# Patient Record
Sex: Male | Born: 1950 | Hispanic: No | Marital: Married | Smoking: Former smoker
Health system: Southern US, Community
[De-identification: ages and names within clinical notes are randomized; demographics above are authoritative.]

## PROBLEM LIST (undated history)

## (undated) DIAGNOSIS — I219 Acute myocardial infarction, unspecified: Secondary | ICD-10-CM

## (undated) DIAGNOSIS — I4510 Unspecified right bundle-branch block: Secondary | ICD-10-CM

## (undated) DIAGNOSIS — R7303 Prediabetes: Secondary | ICD-10-CM

## (undated) DIAGNOSIS — I10 Essential (primary) hypertension: Secondary | ICD-10-CM

## (undated) DIAGNOSIS — E785 Hyperlipidemia, unspecified: Secondary | ICD-10-CM

## (undated) DIAGNOSIS — I2510 Atherosclerotic heart disease of native coronary artery without angina pectoris: Secondary | ICD-10-CM

## (undated) HISTORY — DX: Atherosclerotic heart disease of native coronary artery without angina pectoris: I25.10

## (undated) HISTORY — DX: Prediabetes: R73.03

## (undated) HISTORY — PX: APPENDECTOMY: SHX54

## (undated) HISTORY — DX: Essential (primary) hypertension: I10

## (undated) HISTORY — DX: Acute myocardial infarction, unspecified: I21.9

## (undated) HISTORY — DX: Unspecified right bundle-branch block: I45.10

## (undated) HISTORY — DX: Hyperlipidemia, unspecified: E78.5

---

## 2008-11-15 ENCOUNTER — Emergency Department (HOSPITAL_COMMUNITY): Admission: EM | Admit: 2008-11-15 | Discharge: 2008-11-15 | Payer: Self-pay | Admitting: Emergency Medicine

## 2010-10-12 HISTORY — PX: CORONARY STENT PLACEMENT: SHX1402

## 2010-11-09 ENCOUNTER — Ambulatory Visit: Payer: Self-pay | Admitting: Cardiovascular Disease

## 2010-11-09 ENCOUNTER — Inpatient Hospital Stay (HOSPITAL_COMMUNITY): Admission: EM | Admit: 2010-11-09 | Discharge: 2010-11-11 | Payer: Self-pay | Admitting: Emergency Medicine

## 2010-11-09 ENCOUNTER — Emergency Department (HOSPITAL_COMMUNITY): Admission: EM | Admit: 2010-11-09 | Discharge: 2010-11-09 | Payer: Self-pay | Admitting: Family Medicine

## 2010-11-09 DIAGNOSIS — I219 Acute myocardial infarction, unspecified: Secondary | ICD-10-CM

## 2010-11-09 HISTORY — DX: Acute myocardial infarction, unspecified: I21.9

## 2010-11-23 ENCOUNTER — Ambulatory Visit: Payer: Self-pay | Admitting: Cardiology

## 2010-12-07 ENCOUNTER — Ambulatory Visit: Payer: Self-pay | Admitting: Cardiology

## 2010-12-28 LAB — BASIC METABOLIC PANEL: Glucose: 163 mg/dL

## 2010-12-30 ENCOUNTER — Ambulatory Visit: Payer: Self-pay | Admitting: Cardiology

## 2011-01-31 ENCOUNTER — Encounter: Payer: Self-pay | Admitting: Cardiology

## 2011-01-31 DIAGNOSIS — I10 Essential (primary) hypertension: Secondary | ICD-10-CM | POA: Insufficient documentation

## 2011-01-31 DIAGNOSIS — I4510 Unspecified right bundle-branch block: Secondary | ICD-10-CM | POA: Insufficient documentation

## 2011-01-31 DIAGNOSIS — E785 Hyperlipidemia, unspecified: Secondary | ICD-10-CM | POA: Insufficient documentation

## 2011-02-22 LAB — BASIC METABOLIC PANEL
BUN: 11 mg/dL (ref 6–23)
BUN: 14 mg/dL (ref 6–23)
BUN: 15 mg/dL (ref 6–23)
CO2: 23 mEq/L (ref 19–32)
CO2: 26 mEq/L (ref 19–32)
Calcium: 8.3 mg/dL — ABNORMAL LOW (ref 8.4–10.5)
Chloride: 105 mEq/L (ref 96–112)
Chloride: 106 mEq/L (ref 96–112)
Creatinine, Ser: 0.86 mg/dL (ref 0.4–1.5)
Creatinine, Ser: 0.89 mg/dL (ref 0.4–1.5)
Creatinine, Ser: 0.9 mg/dL (ref 0.4–1.5)
Glucose, Bld: 105 mg/dL — ABNORMAL HIGH (ref 70–99)
Glucose, Bld: 112 mg/dL — ABNORMAL HIGH (ref 70–99)
Potassium: 3.6 mEq/L (ref 3.5–5.1)
Potassium: 3.9 mEq/L (ref 3.5–5.1)
Sodium: 139 mEq/L (ref 135–145)

## 2011-02-22 LAB — CARDIAC PANEL(CRET KIN+CKTOT+MB+TROPI)
CK, MB: 3.7 ng/mL (ref 0.3–4.0)
CK, MB: 4.4 ng/mL — ABNORMAL HIGH (ref 0.3–4.0)
Relative Index: 0.9 (ref 0.0–2.5)

## 2011-02-22 LAB — CBC
HCT: 38.9 % — ABNORMAL LOW (ref 39.0–52.0)
HCT: 46 % (ref 39.0–52.0)
Hemoglobin: 14.8 g/dL (ref 13.0–17.0)
MCH: 30.6 pg (ref 26.0–34.0)
MCH: 30.6 pg (ref 26.0–34.0)
MCHC: 34.1 g/dL (ref 30.0–36.0)
MCV: 89.6 fL (ref 78.0–100.0)
MCV: 89.8 fL (ref 78.0–100.0)
Platelets: 223 10*3/uL (ref 150–400)
RBC: 4.34 MIL/uL (ref 4.22–5.81)
RBC: 4.83 MIL/uL (ref 4.22–5.81)
RDW: 13.3 % (ref 11.5–15.5)
RDW: 13.4 % (ref 11.5–15.5)
WBC: 8.6 10*3/uL (ref 4.0–10.5)

## 2011-02-22 LAB — LIPID PANEL
Cholesterol: 252 mg/dL — ABNORMAL HIGH (ref 0–200)
LDL Cholesterol: 187 mg/dL — ABNORMAL HIGH (ref 0–99)
Total CHOL/HDL Ratio: 5.6 RATIO

## 2011-02-22 LAB — CK TOTAL AND CKMB (NOT AT ARMC)
CK, MB: 7.4 ng/mL (ref 0.3–4.0)
Relative Index: 1 (ref 0.0–2.5)
Total CK: 716 U/L — ABNORMAL HIGH (ref 7–232)

## 2011-02-22 LAB — POCT CARDIAC MARKERS
CKMB, poc: 8.4 ng/mL (ref 1.0–8.0)
Troponin i, poc: <0.05 ng/mL (ref 0.00–0.09)

## 2011-02-22 LAB — DIFFERENTIAL
Basophils Absolute: 0 10*3/uL (ref 0.0–0.1)
Basophils Relative: 0 % (ref 0–1)
Eosinophils Absolute: 0.1 10*3/uL (ref 0.0–0.7)
Eosinophils Relative: 1 % (ref 0–5)
Monocytes Absolute: 0.9 10*3/uL (ref 0.1–1.0)

## 2011-02-22 LAB — PROTIME-INR: Prothrombin Time: 13.4 seconds (ref 11.6–15.2)

## 2011-02-22 LAB — HEPARIN LEVEL (UNFRACTIONATED): Heparin Unfractionated: 0.65 IU/mL (ref 0.30–0.70)

## 2011-03-11 ENCOUNTER — Encounter: Payer: Self-pay | Admitting: Cardiology

## 2011-03-11 ENCOUNTER — Ambulatory Visit (INDEPENDENT_AMBULATORY_CARE_PROVIDER_SITE_OTHER): Payer: Self-pay | Admitting: Cardiology

## 2011-03-11 DIAGNOSIS — I10 Essential (primary) hypertension: Secondary | ICD-10-CM

## 2011-03-11 DIAGNOSIS — E119 Type 2 diabetes mellitus without complications: Secondary | ICD-10-CM | POA: Insufficient documentation

## 2011-03-11 DIAGNOSIS — R7303 Prediabetes: Secondary | ICD-10-CM | POA: Insufficient documentation

## 2011-03-11 DIAGNOSIS — R079 Chest pain, unspecified: Secondary | ICD-10-CM

## 2011-03-11 NOTE — Assessment & Plan Note (Signed)
The patient is trying to stay on a careful diet.  He also is still on metformin 500 mg daily.  His last blood sugar in January was 163.  He is not having any hypoglycemic reactions.

## 2011-03-11 NOTE — Assessment & Plan Note (Signed)
On his last visit we increased his lisinopril to twice a day and continued his hydrochlorothiazide 25 mg daily.  He returns today and his blood pressure is normal.  He'll continue same blood pressure meds.

## 2011-03-11 NOTE — Assessment & Plan Note (Signed)
The patient has a past history of a non-ST elevation myocardial infarction on 11/09/10.  He was found at cardiac catheterization on 11/10/10 to have significant proximal LAD stenosis and he underwent PCI with a drug-eluting stent.  He has not had any chest pain at rest.  He still has some chest tightness with strenuous exertion.  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  He is now looking for other types of work.

## 2011-03-11 NOTE — Progress Notes (Signed)
HPI: This pleasant 60 year old Spanish-speaking gentleman is seen for a scheduled two-month followup office visit.  He has a history of ischemic heart disease, prior non-ST elevation myocardial infarction, placement of a drug-eluting stent in his LAD, and a history of essential hypertension hypercholesterolemia and adult onset diabetes mellitus.  He was initially hospitalized on 11/09/10 and underwent cardiac catheterization on 11/10/10.  He was found to have single vessel LAD disease which required a drug-eluting stent.  At the time of his admission his cholesterol was 252 and LDL was 187.  He was sent home on pravastatin.  Metformin was added at his last Office visit.  Current Outpatient Prescriptions  Medication Sig Dispense Refill  . aspirin 325 MG tablet Take 325 mg by mouth daily.        . clopidogrel (PLAVIX) 75 MG tablet Take by mouth daily.        Marland Kitchen diltiazem (CARDIZEM) 90 MG tablet Take 90 mg by mouth 2 (two) times daily.        . hydrochlorothiazide 25 MG tablet Take 25 mg by mouth daily.        Marland Kitchen lisinopril (PRINIVIL,ZESTRIL) 20 MG tablet Take 20 mg by mouth 2 (two) times daily.        . metFORMIN (GLUMETZA) 500 MG (MOD) 24 hr tablet Take 500 mg by mouth daily.        . metoprolol (LOPRESSOR) 50 MG tablet Take 50 mg by mouth 2 (two) times daily.        . nitroGLYCERIN (NITROSTAT) 0.4 MG SL tablet Place 0.4 mg under the tongue every 5 (five) minutes as needed.        . pravastatin (PRAVACHOL) 80 MG tablet Take 80 mg by mouth daily.          No Known Allergies  Patient Active Problem List  Diagnoses  . HTN (hypertension)  . Hyperlipemia  . Myocardial infarction  . Incomplete RBBB  . Chest pain on exertion  . Diabetes mellitus type 2, noninsulin dependent    History  Smoking status  . Former Smoker  . Types: Cigarettes  . Quit date: 01/31/1981  Smokeless tobacco  . Not on file    History  Alcohol Use     No family history on file.  Review of Systems: The patient  denies any heat or cold intolerance.  No weight gain or weight loss.  The patient denies headaches or blurry vision.  There is no cough or sputum production.  The patient denies dizziness.  There is no hematuria or hematochezia.  The patient denies any muscle aches or arthritis.  The patient denies any rash.  The patient denies frequent falling or instability.  There is no history of depression or anxiety.  All other systems were reviewed and are negative.   Physical Exam: Filed Vitals:   03/11/11 1547  BP: 126/80  Pulse: 64  This is a well-developed well-nourished muscular gentleman in no acute distress.Pupils equal and reactive.   Extraocular Movements are full.  There is no scleral icterus.  The mouth and pharynx are normal.  The neck is supple.  The carotids reveal no bruits.  The jugular venous pressure is normal.  The thyroid is not enlarged.  There is no lymphadenopathy.The chest is clear to percussion and auscultation. There are no rales or rhonchi. Expansion of the chest is symmetrical.The precordium is quiet.  The first heart sound is normal.  The second heart sound is physiologically split.  There is no murmur gallop  rub or click.  There is no abnormal lift or heave.The abdomen is soft and nontender. Bowel sounds are normal. The liver and spleen are not enlarged. There Are no abdominal masses. There are no bruits.The pedal pulses are good.  There is no phlebitis or edema.  There is no cyanosis or clubbing.Strength is normal and symmetrical in all extremities.  There is no lateralizing weakness.  There are no sensory deficits.    Assessment / Plan: Continue present medication.  Recheck in 3 months for followup office visit EKG and fasting lipid panel and chemistries.

## 2011-03-31 ENCOUNTER — Encounter: Payer: Self-pay | Admitting: Cardiology

## 2011-06-06 ENCOUNTER — Other Ambulatory Visit: Payer: Self-pay | Admitting: Cardiovascular Disease

## 2011-06-07 ENCOUNTER — Other Ambulatory Visit: Payer: Self-pay | Admitting: *Deleted

## 2011-06-07 DIAGNOSIS — I10 Essential (primary) hypertension: Secondary | ICD-10-CM

## 2011-06-07 MED ORDER — LISINOPRIL 20 MG PO TABS: ORAL_TABLET | ORAL | Status: DC

## 2011-06-07 NOTE — Telephone Encounter (Signed)
Called pharmacy took care of this issue

## 2011-06-14 ENCOUNTER — Telehealth: Payer: Self-pay | Admitting: Cardiology

## 2011-06-14 NOTE — Telephone Encounter (Signed)
Will get clearance at office visit

## 2011-06-14 NOTE — Telephone Encounter (Signed)
SON IN [employer] CALLING TO RESCHEDULE APPT.  ADVISED UNABLE TO SCHEDULE NEXT WEEK.  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  AND NEEDS WORK CLEARANCE.  PLEASE CALL.

## 2011-06-14 NOTE — Telephone Encounter (Signed)
Reschedule appointment

## 2011-06-21 ENCOUNTER — Other Ambulatory Visit: Payer: Self-pay | Admitting: *Deleted

## 2011-06-21 DIAGNOSIS — E785 Hyperlipidemia, unspecified: Secondary | ICD-10-CM

## 2011-06-22 ENCOUNTER — Ambulatory Visit: Payer: Self-pay | Admitting: Cardiology

## 2011-06-22 ENCOUNTER — Other Ambulatory Visit: Payer: Self-pay | Admitting: *Deleted

## 2011-06-28 ENCOUNTER — Encounter: Payer: Self-pay | Admitting: Cardiology

## 2011-06-29 ENCOUNTER — Encounter: Payer: Self-pay | Admitting: Nurse Practitioner

## 2011-06-29 ENCOUNTER — Other Ambulatory Visit (INDEPENDENT_AMBULATORY_CARE_PROVIDER_SITE_OTHER): Payer: Self-pay | Admitting: *Deleted

## 2011-06-29 ENCOUNTER — Ambulatory Visit (INDEPENDENT_AMBULATORY_CARE_PROVIDER_SITE_OTHER): Payer: Self-pay | Admitting: Nurse Practitioner

## 2011-06-29 VITALS — BP 158/92 | HR 68 | Ht 68.0 in | Wt 197.0 lb

## 2011-06-29 DIAGNOSIS — E785 Hyperlipidemia, unspecified: Secondary | ICD-10-CM

## 2011-06-29 DIAGNOSIS — I10 Essential (primary) hypertension: Secondary | ICD-10-CM

## 2011-06-29 DIAGNOSIS — I2510 Atherosclerotic heart disease of native coronary artery without angina pectoris: Secondary | ICD-10-CM

## 2011-06-29 LAB — HEPATIC FUNCTION PANEL
ALT: 24 U/L (ref 0–53)
AST: 22 U/L (ref 0–37)
Albumin: 4.4 g/dL (ref 3.5–5.2)
Alkaline Phosphatase: 59 U/L (ref 39–117)
Bilirubin, Direct: 0 mg/dL (ref 0.0–0.3)
Total Bilirubin: 0.6 mg/dL (ref 0.3–1.2)
Total Protein: 7.4 g/dL (ref 6.0–8.3)

## 2011-06-29 LAB — BASIC METABOLIC PANEL
BUN: 22 mg/dL (ref 6–23)
CO2: 29 mEq/L (ref 19–32)
Calcium: 8.9 mg/dL (ref 8.4–10.5)
Chloride: 108 mEq/L (ref 96–112)
Creatinine, Ser: 1.1 mg/dL (ref 0.4–1.5)
GFR: 76.01 mL/min (ref >60.00)
Glucose, Bld: 122 mg/dL — ABNORMAL HIGH (ref 70–99)
Potassium: 4.7 mEq/L (ref 3.5–5.1)
Sodium: 143 mEq/L (ref 135–145)

## 2011-06-29 LAB — LIPID PANEL
Cholesterol: 190 mg/dL (ref 0–200)
HDL: 47.5 mg/dL (ref >39.00)
LDL Cholesterol: 104 mg/dL — ABNORMAL HIGH (ref 0–99)
Total CHOL/HDL Ratio: 4
Triglycerides: 195 mg/dL — ABNORMAL HIGH (ref 0.0–149.0)
VLDL: 39 mg/dL (ref 0.0–40.0)

## 2011-06-29 NOTE — Patient Instructions (Addendum)
Get back on all your medicines I want to see you in a month Watch your salt You may return to work with light duties. No heavy lifting over 30 pounds. Your labs are checked today. Call for any problems.

## 2011-06-29 NOTE — Assessment & Plan Note (Signed)
Prior NSTEMI with DES to the LAD in November of 2011. He is doing well. He may return to light duties. No heavy lifting over 30 pounds.

## 2011-06-29 NOTE — Assessment & Plan Note (Signed)
Labs are checked today.  

## 2011-06-29 NOTE — Progress Notes (Signed)
    Phillip Wells Date of Birth: 07-11-1951   History of Present Illness: [name] is seen back today for a 4 month visit. He is seen for Dr. Patty Sermons. He had a NSTEMI in November, treated with DES to the LAD. He has multiple risk factors. He is here with his family member who serves as the interpreter. He does not speak [place]. Overall, he is doing well. He continues to have some chest pain with heavy lifting. He had been let go from his job because of this, but now they would like for him to return to lighter duties. His medicines are difficult to clarify. It sounds like he has missed some due to a recent trip to [place]. He has no complaint.  Current Outpatient Prescriptions on File Prior to Visit  Medication Sig Dispense Refill  . aspirin 325 MG tablet Take 325 mg by mouth daily.        . clopidogrel (PLAVIX) 75 MG tablet Take by mouth daily.        Marland Kitchen diltiazem (CARDIZEM) 90 MG tablet TAKE ONE TABLET BY MOUTH TWICE DAILY  60 tablet  12  . lisinopril (PRINIVIL,ZESTRIL) 20 MG tablet Daily or as directed  90 tablet  3  . metFORMIN (GLUMETZA) 500 MG (MOD) 24 hr tablet Take 500 mg by mouth daily.        . metoprolol (LOPRESSOR) 50 MG tablet TAKE ONE TABLET BY MOUTH TWICE DAILY  60 tablet  12  . nitroGLYCERIN (NITROSTAT) 0.4 MG SL tablet Place 0.4 mg under the tongue every 5 (five) minutes as needed.        . pravastatin (PRAVACHOL) 80 MG tablet TAKE ONE TABLET BY MOUTH AT BEDTIME  30 tablet  12  . hydrochlorothiazide 25 MG tablet Take 25 mg by mouth daily.          No Known Allergies  Past Medical History  Diagnosis Date  . Coronary artery disease     s/p DES to the LAD  . HTN (hypertension)   . Hyperlipemia   . Myocardial infarction 11/09/10  . Incomplete RBBB     Past Surgical History  Procedure Date  . Coronary stent placement November 2011    DES to LAD    History  Smoking status  . Former Smoker  . Types: Cigarettes  . Quit date: 01/31/1981    Smokeless tobacco  . Not on file    History  Alcohol Use No    Family History  Problem Relation Age of Onset  . Heart disease Neg Hx     Review of Systems: The review of systems is as above. He does not check his blood pressure at home.  All other systems were reviewed and are negative.  Physical Exam: BP 158/92  Pulse 68  Ht 5\' 8"  (1.727 m)  Wt 197 lb (89.359 kg)  BMI 29.95 kg/m2 Patient is very pleasant and in no acute distress. He is Spanish speaking. Skin is warm and dry. Color is normal.  He is very tanned. HEENT is unremarkable. Normocephalic/atraumatic. PERRL. Sclera are nonicteric. Neck is supple. No masses. No JVD. Lungs are clear. Cardiac exam shows a regular rate and rhythm. Abdomen is soft. Extremities are without edema. Gait and ROM are intact. No gross neurologic deficits noted.  LABORATORY DATA: PENDING   Assessment / Plan:

## 2011-06-29 NOTE — Assessment & Plan Note (Signed)
Blood pressure is back up. His family member will clarify all of his medicines. He is to get back on all of his medicines. I will see him back in one month.

## 2011-07-01 ENCOUNTER — Telehealth: Payer: Self-pay | Admitting: *Deleted

## 2011-07-01 DIAGNOSIS — E785 Hyperlipidemia, unspecified: Secondary | ICD-10-CM

## 2011-07-01 NOTE — Telephone Encounter (Signed)
Advised son in law of labs.  Faxed to PCP

## 2011-07-01 NOTE — Progress Notes (Signed)
Advised son in law

## 2011-07-01 NOTE — Telephone Encounter (Signed)
Message copied by Burnell Blanks on Fri Jul 01, 2011 11:40 AM ------      Message from: Rosalio Macadamia      Created: Wed Jun 29, 2011  3:04 PM       Ok to report. Send result to his PCP for review.

## 2011-07-01 NOTE — Telephone Encounter (Signed)
Message copied by Burnell Blanks on Fri Jul 01, 2011 11:37 AM ------      Message from: Rosalio Macadamia      Created: Wed Jun 29, 2011  3:05 PM       Ok to report. Lipids aren't too bad. Stay on Pravachol. Need to cut back on sugars in his diet. Recheck in 6 months

## 2011-07-01 NOTE — Telephone Encounter (Signed)
Advised and faxed to PCP

## 2011-08-01 ENCOUNTER — Ambulatory Visit (INDEPENDENT_AMBULATORY_CARE_PROVIDER_SITE_OTHER): Payer: Self-pay | Admitting: Nurse Practitioner

## 2011-08-01 ENCOUNTER — Encounter: Payer: Self-pay | Admitting: Nurse Practitioner

## 2011-08-01 VITALS — BP 138/88 | HR 62 | Ht 67.5 in | Wt 194.8 lb

## 2011-08-01 DIAGNOSIS — I2510 Atherosclerotic heart disease of native coronary artery without angina pectoris: Secondary | ICD-10-CM

## 2011-08-01 DIAGNOSIS — I10 Essential (primary) hypertension: Secondary | ICD-10-CM

## 2011-08-01 NOTE — Assessment & Plan Note (Signed)
Doing well. Back to light duty at work. No chest pain.

## 2011-08-01 NOTE — Assessment & Plan Note (Signed)
Blood pressure looks better today. No change in his medicines. He is advised that he may take an extra Lisinopril for elevated blood pressure. He will think about getting a cuff to monitor at home. Will see him back in 4 months. Patient is agreeable to this plan and will call if any problems develop in the interim.

## 2011-08-01 NOTE — Progress Notes (Signed)
    Phillip Wells Date of Birth: 1950-12-20   History of Present Illness: He is back today for a one month check. He is seen for Phillip Wells. He is taking all of his medicines. His family member is the interpreter. He had a NSTEMI in November treated with DES to the LAD. Blood pressure has been an issue. He is taking all of his medicines. Feels pretty good. Has returned to light duty. He does bruise easily but overall is doing ok.   Current Outpatient Prescriptions on File Prior to Visit  Medication Sig Dispense Refill  . aspirin 325 MG tablet Take 325 mg by mouth daily.        . clopidogrel (PLAVIX) 75 MG tablet Take by mouth daily.        Marland Kitchen diltiazem (CARDIZEM) 90 MG tablet TAKE ONE TABLET BY MOUTH TWICE DAILY  60 tablet  12  . hydrochlorothiazide 25 MG tablet Take 25 mg by mouth daily.        Marland Kitchen lisinopril (PRINIVIL,ZESTRIL) 20 MG tablet Daily or as directed  90 tablet  3  . metFORMIN (GLUMETZA) 500 MG (MOD) 24 hr tablet Take 500 mg by mouth daily.        . metoprolol (LOPRESSOR) 50 MG tablet TAKE ONE TABLET BY MOUTH TWICE DAILY  60 tablet  12  . nitroGLYCERIN (NITROSTAT) 0.4 MG SL tablet Place 0.4 mg under the tongue every 5 (five) minutes as needed.        . pravastatin (PRAVACHOL) 80 MG tablet TAKE ONE TABLET BY MOUTH AT BEDTIME  30 tablet  12    No Known Allergies  Past Medical History  Diagnosis Date  . Coronary artery disease     s/p DES to the LAD  . HTN (hypertension)   . Hyperlipemia   . Myocardial infarction 11/09/10  . Incomplete RBBB     Past Surgical History  Procedure Date  . Coronary stent placement November 2011    DES to LAD    History  Smoking status  . Former Smoker  . Types: Cigarettes  . Quit date: 01/31/1981  Smokeless tobacco  . Not on file    History  Alcohol Use No    Family History  Problem Relation Age of Onset  . Heart disease Neg Hx     Review of Systems: The review of systems is positive for mild bruising. He is  considering getting a blood pressure cuff.  All other systems were reviewed and are negative.  Physical Exam: BP 138/88  Pulse 62  Ht 5' 7.5" (1.715 m)  Wt 194 lb 12.8 oz (88.361 kg)  BMI 30.06 kg/m2 Patient is very pleasant and in no acute distress. Skin is warm and dry. Color is normal.  HEENT is unremarkable. Normocephalic/atraumatic. PERRL. Sclera are nonicteric. Neck is supple. No masses. No JVD. Lungs are clear. Cardiac exam shows a regular rate and rhythm. Abdomen is soft. Extremities are without edema. Gait and ROM are intact. No gross neurologic deficits noted.  LABORATORY DATA:   Assessment / Plan:

## 2011-08-01 NOTE — Patient Instructions (Addendum)
Stay on your current medicines We will see you back in 4 months Call for any problems. If you can get a blood pressure cuff at home that would be great ([place]) Goal BP is less than 140/90 May take an extra Lisinopril if blood pressure staying above 160 to 170/85 to 90. Let us know if you are having to take extra medicines.    Hipertensin (presin arterial elevada) (Hypertension, High Blood Pressure) Cuando el corazn late [employer] a travs de las arterias. La fuerza que se origina es la presin arterial. Si la presin es demasiado elevada, se denomina hipertensin. El peligro radica en que puede sufrirla y no saberlo. Hipertensin puede significar que su corazn debe trabajar ms intensamente para bombear sangre. Las arterias pueden Dietitian o rgidas. [place] extra [place] el riesgo de enfermedades cardacas, ictus y [employer].  La presin arterial est formada por dos nmeros: el nmero mayor sobre el nmero menor, por ejemplo110/70. Se seala "110/72". Los valores ideales son por debajo de 120 para el nmero ms alto (sistlica) y por debajo de 80 para el ms bajo (diastlica). Anote su presin sangunea hoy. Debe prestar mucha atencin a su presin arterial si sufre alguna otra enfermedad como:  Insuficiencia cardaca  Ataques cardiacos previos   Diabetes   Enfermedad renal crnica   Ictus previo   Mltiples factores de riesgo para enfermedades cardacas   Para diagnosticar si usted sufre hipertensin arterial, debe medirse la presin mientras encuentra sentado con el brazo elevado a la altura del nivel del corazn. Debe medirse al menos 2 veces. Una nica lectura de presin arterial elevada (especialmente en el servicio de emergencias) no significa que necesita tratamiento. Hay enfermedades en las que la presin arterial es diferente en ambos brazos. Es importante que consulte rpidamente con su mdico para un control. La [employer] de las personas sufren  hipertensin esencial, lo que significa que no tiene una causa especfica. Este tipo de hipertensin puede bajarse modificando algunos factores en el estilo de vida como:  Librarian, academic.  El consumo de cigarrillos.   La falta de actividad fsica.   Peso excesivo  Consumo de drogas y alcohol.   Consumiendo menos sal   La mayora de las personas no tienen sntomas hasta que la hipertensin ocasiona un dao en el organismo. El tratamiento efectivo puede evitar, Designer, industrial/product o reducir ese dao. TRATAMIENTO: El tratamiento para la hipertensin, cuando se ha identificado una causa, est dirigido a la misma Hay un gran nmero en medicamentos para tratarla. Se agrupan en diferentes categoras y Media planner los medicamentos indicados para usted. Muchos medicamentos disponibles tienen efectos secundarios. Debe comentar los efectos secundarios con su mdico. Si la presin arterial permanece elevada despus de modificar su estilo de vida o comenzar a tomar medicamentos:  Los medicamentos deben ser reemplazados   Puede ser necesario evaluar otros problemas.   Debe estar seguro que comprende las indicaciones, que sabe cmo y cundo [employer].   Asegrese de Education officer, environmental un control con su mdico dentro del tiempo indicado (generalmente dentro de las [employer]) para volver a Systems analyst presin arterial y Dentist los medicamentos prescritos.   Si est tomando ms de un medicamento para la presin arterial, asegrese que sabe cmo y en qu momentos debe tomarlos. Tomar los medicamentos al mismo tiempo puede dar como resultado un gran descenso en la presin arterial.  SOLICITE ATENCIN MDICA DE INMEDIATO SI PRESENTA:  Dolor de cabeza intenso, visin borrosa o cambios en la  visin, o confusin.   Debilidad o adormecimientos inusuales o sensacin de desmayo.   Dolor de pecho o abdominal intenso, vmitos o problemas para respirar.  ASEGRESE QUE:   Comprende estas instrucciones.    Controlar su enfermedad.   Solicitar ayuda inmediatamente si no mejora o si empeora.  Document Released: 11/28/2005 Document Re-Released: 05/18/2010 [facility] Patient Information 2011 [place], [place].

## 2011-09-16 LAB — POCT URINALYSIS DIP (DEVICE)
Hgb urine dipstick: NEGATIVE
Nitrite: NEGATIVE
Urobilinogen, UA: 0.2 mg/dL (ref 0.0–1.0)
pH: 7 (ref 5.0–8.0)

## 2011-12-02 ENCOUNTER — Encounter: Payer: Self-pay | Admitting: Cardiology

## 2011-12-02 ENCOUNTER — Ambulatory Visit (INDEPENDENT_AMBULATORY_CARE_PROVIDER_SITE_OTHER): Payer: Self-pay | Admitting: Cardiology

## 2011-12-02 VITALS — BP 120/70 | HR 60 | Ht 67.0 in | Wt 187.0 lb

## 2011-12-02 DIAGNOSIS — I119 Hypertensive heart disease without heart failure: Secondary | ICD-10-CM

## 2011-12-02 DIAGNOSIS — E785 Hyperlipidemia, unspecified: Secondary | ICD-10-CM

## 2011-12-02 DIAGNOSIS — E7800 Pure hypercholesterolemia, unspecified: Secondary | ICD-10-CM

## 2011-12-02 DIAGNOSIS — E119 Type 2 diabetes mellitus without complications: Secondary | ICD-10-CM

## 2011-12-02 DIAGNOSIS — I10 Essential (primary) hypertension: Secondary | ICD-10-CM

## 2011-12-02 DIAGNOSIS — I219 Acute myocardial infarction, unspecified: Secondary | ICD-10-CM

## 2011-12-02 DIAGNOSIS — I2510 Atherosclerotic heart disease of native coronary artery without angina pectoris: Secondary | ICD-10-CM

## 2011-12-02 NOTE — Patient Instructions (Signed)
Your physician recommends that you continue on your current medications as directed. Please refer to the Current Medication list given to you today. Your physician recommends that you schedule a follow-up appointment in: 3 months with labs a few days prior to appointment (lp/bmet/hfp/a1c)

## 2011-12-02 NOTE — Progress Notes (Signed)
Pearse Shiffler Paz-Castillo Date of Birth:  July 23, 1951 [facility] [address]   [address] [place], [place]  [address] [phone]           Fax   [phone]  HPI: This pleasant 60 year old gentleman is seen for a scheduled followup office visit.  He has a past history of known ischemic heart disease and had a non-STEMI in November 2011 treated with a drug-eluting stent to his LAD.  He has had a history of high blood pressure and diabetes and hypercholesterol.  Since last visit he has return to work at [employer] duty and stays physically active and works 8 hours a day in [employer].  He has not been experiencing any chest pain or shortness of breath.  Current Outpatient Prescriptions  Medication Sig Dispense Refill  . aspirin 325 MG tablet Take 325 mg by mouth daily.        . clopidogrel (PLAVIX) 75 MG tablet Take by mouth daily.        Marland Kitchen diltiazem (CARDIZEM) 90 MG tablet TAKE ONE TABLET BY MOUTH TWICE DAILY  60 tablet  12  . hydrochlorothiazide 25 MG tablet Take 25 mg by mouth daily.        Marland Kitchen lisinopril (PRINIVIL,ZESTRIL) 20 MG tablet Daily or as directed  90 tablet  3  . metFORMIN (GLUMETZA) 500 MG (MOD) 24 hr tablet Take 500 mg by mouth daily.        . metoprolol (LOPRESSOR) 50 MG tablet TAKE ONE TABLET BY MOUTH TWICE DAILY  60 tablet  12  . nitroGLYCERIN (NITROSTAT) 0.4 MG SL tablet Place 0.4 mg under the tongue every 5 (five) minutes as needed.        . pravastatin (PRAVACHOL) 80 MG tablet TAKE ONE TABLET BY MOUTH AT BEDTIME  30 tablet  12    No Known Allergies  Patient Active Problem List  Diagnoses  . HTN (hypertension)  . Hyperlipemia  . Myocardial infarction  . Incomplete RBBB  . Chest pain on exertion  . Diabetes mellitus type 2, noninsulin dependent  . CAD (coronary artery disease)    History  Smoking status  . Former Smoker  . Types: Cigarettes  . Quit date: 01/31/1981  Smokeless tobacco  . Not on file    History  Alcohol Use  No    Family History  Problem Relation Age of Onset  . Heart disease Neg Hx     Review of Systems: The patient denies any heat or cold intolerance.  No weight gain or weight loss.  The patient denies headaches or blurry vision.  There is no cough or sputum production.  The patient denies dizziness.  There is no hematuria or hematochezia.  The patient denies any muscle aches or arthritis.  The patient denies any rash.  The patient denies frequent falling or instability.  There is no history of depression or anxiety.  All other systems were reviewed and are negative.   Physical Exam: Filed Vitals:   12/02/11 1540  BP: 120/70  Pulse: 60   the general appearance reveals a well-developed well-nourished gentleman in no distress.The head and neck exam reveals pupils equal and reactive.  Extraocular movements are full.  There is no scleral icterus.  The mouth and pharynx are normal.  The neck is supple.  The carotids reveal no bruits.  The jugular venous pressure is normal.  The  thyroid is not enlarged.  There is no lymphadenopathy.  The chest is clear to  percussion and auscultation.  There are no rales or rhonchi.  Expansion of the chest is symmetrical.  The precordium is quiet.  The first heart sound is normal.  The second heart sound is physiologically split.  There is no murmur gallop rub or click.  There is no abnormal lift or heave.  The abdomen is soft and nontender.  The bowel sounds are normal.  The liver and spleen are not enlarged.  There are no abdominal masses.  There are no abdominal bruits.  Extremities reveal good pedal pulses.  There is no phlebitis or edema.  There is no cyanosis or clubbing.  Strength is normal and symmetrical in all extremities.  There is no lateralizing weakness.  There are no sensory deficits.  The skin is warm and dry.  There is no rash.      Assessment / Plan:  Continue same medication.  Recheck in 3 months for followup office visit and we will get a fasting  lipid panel hepatic function panel basal metabolic panel and A1c prior to his next visit.

## 2011-12-02 NOTE — Assessment & Plan Note (Signed)
The patient remains on pravastatin 80 mg daily.  He is having no myalgias or side effects.

## 2011-12-02 NOTE — Assessment & Plan Note (Signed)
The patient has not been having any headaches dizziness or syncope. 

## 2011-12-02 NOTE — Assessment & Plan Note (Signed)
The patient denies any exertional chest pain or shortness of breath

## 2011-12-23 ENCOUNTER — Other Ambulatory Visit: Payer: Self-pay | Admitting: *Deleted

## 2012-03-02 ENCOUNTER — Other Ambulatory Visit (INDEPENDENT_AMBULATORY_CARE_PROVIDER_SITE_OTHER): Payer: Self-pay

## 2012-03-02 DIAGNOSIS — E119 Type 2 diabetes mellitus without complications: Secondary | ICD-10-CM

## 2012-03-02 DIAGNOSIS — E7800 Pure hypercholesterolemia, unspecified: Secondary | ICD-10-CM

## 2012-03-02 DIAGNOSIS — I119 Hypertensive heart disease without heart failure: Secondary | ICD-10-CM

## 2012-03-02 LAB — BASIC METABOLIC PANEL
BUN: 20 mg/dL (ref 6–23)
Calcium: 9.1 mg/dL (ref 8.4–10.5)
Creatinine, Ser: 0.9 mg/dL (ref 0.4–1.5)
GFR: 86.18 mL/min (ref >60.00)
Glucose, Bld: 102 mg/dL — ABNORMAL HIGH (ref 70–99)
Potassium: 4.1 mEq/L (ref 3.5–5.1)

## 2012-03-02 LAB — HEPATIC FUNCTION PANEL
ALT: 17 U/L (ref 0–53)
AST: 21 U/L (ref 0–37)
Bilirubin, Direct: 0 mg/dL (ref 0.0–0.3)
Total Protein: 7.1 g/dL (ref 6.0–8.3)

## 2012-03-02 NOTE — Progress Notes (Signed)
Quick Note:  Please make copy of labs for patient visit. ______ 

## 2012-03-06 ENCOUNTER — Encounter: Payer: Self-pay | Admitting: Cardiology

## 2012-03-06 ENCOUNTER — Ambulatory Visit: Payer: Self-pay | Admitting: Cardiology

## 2012-03-06 ENCOUNTER — Ambulatory Visit (INDEPENDENT_AMBULATORY_CARE_PROVIDER_SITE_OTHER): Payer: Self-pay | Admitting: Cardiology

## 2012-03-06 VITALS — BP 130/76 | HR 48 | Wt 194.0 lb

## 2012-03-06 DIAGNOSIS — I10 Essential (primary) hypertension: Secondary | ICD-10-CM

## 2012-03-06 DIAGNOSIS — I2510 Atherosclerotic heart disease of native coronary artery without angina pectoris: Secondary | ICD-10-CM

## 2012-03-06 DIAGNOSIS — I119 Hypertensive heart disease without heart failure: Secondary | ICD-10-CM

## 2012-03-06 DIAGNOSIS — E119 Type 2 diabetes mellitus without complications: Secondary | ICD-10-CM

## 2012-03-06 NOTE — Assessment & Plan Note (Signed)
His hemoglobin A1c is down to 6.3 on oral medication.  His lipids are satisfactory.  Is not having any myalgias from his statin therapy.

## 2012-03-06 NOTE — Assessment & Plan Note (Signed)
Patient has not been experiencing any chest pain or shortness of breath.

## 2012-03-06 NOTE — Progress Notes (Signed)
Jens Som Date of Birth:  05/21/1951 [facility] [address] [place], [place]  [address] [phone]  Fax   [phone]  HPI: This pleasant 61 year old gentleman is seen for a scheduled followup office visit.  He had a non-stenting in November 2011 treated with a drug-eluting stent to his LAD.  He has risk factors of high blood pressure diabetes and hypercholesterolemia.  He has been feeling well.  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  Current Outpatient Prescriptions  Medication Sig Dispense Refill  . aspirin 325 MG tablet Take 325 mg by mouth daily.        . clopidogrel (PLAVIX) 75 MG tablet Take by mouth daily.        Marland Kitchen diltiazem (CARDIZEM) 90 MG tablet TAKE ONE TABLET BY MOUTH TWICE DAILY  60 tablet  12  . hydrochlorothiazide 25 MG tablet Take 25 mg by mouth daily.        Marland Kitchen lisinopril (PRINIVIL,ZESTRIL) 20 MG tablet Daily or as directed  90 tablet  3  . metFORMIN (GLUMETZA) 500 MG (MOD) 24 hr tablet Take 500 mg by mouth daily.        . metoprolol (LOPRESSOR) 50 MG tablet TAKE ONE TABLET BY MOUTH TWICE DAILY  60 tablet  12  . nitroGLYCERIN (NITROSTAT) 0.4 MG SL tablet Place 0.4 mg under the tongue every 5 (five) minutes as needed.        . pravastatin (PRAVACHOL) 80 MG tablet TAKE ONE TABLET BY MOUTH AT BEDTIME  30 tablet  12    No Known Allergies  Patient Active Problem List  Diagnoses  . HTN (hypertension)  . Hyperlipemia  . Myocardial infarction  . Incomplete RBBB  . Chest pain on exertion  . Diabetes mellitus type 2, noninsulin dependent  . CAD (coronary artery disease)    History  Smoking status  . Former Smoker  . Types: Cigarettes  . Quit date: 01/31/1981  Smokeless tobacco  . Not on file    History  Alcohol Use No    Family History  Problem Relation Age of Onset  . Heart disease Neg Hx     Review of Systems: The patient denies any heat or cold intolerance.  No weight gain or  weight loss.  The patient denies headaches or blurry vision.  There is no cough or sputum production.  The patient denies dizziness.  There is no hematuria or hematochezia.  The patient denies any muscle aches or arthritis.  The patient denies any rash.  The patient denies frequent falling or instability.  There is no history of depression or anxiety.  All other systems were reviewed and are negative.   Physical Exam: Filed Vitals:   03/06/12 1616  BP: 130/76  Pulse: 48   the general appearance reveals a well-developed well-nourished gentleman in no distress.  A relative was with him to serve as an interpreter.The head and neck exam reveals pupils equal and reactive.  Extraocular movements are full.  There is no scleral icterus.  The mouth and pharynx are normal.  The neck is supple.  The carotids reveal no bruits.  The jugular venous pressure is normal.  The  thyroid is not enlarged.  There is no lymphadenopathy.  The chest is clear to percussion and auscultation.  There are no rales or rhonchi.  Expansion of the chest is symmetrical.  The precordium is quiet.  The first heart sound is normal.  The second  heart sound is physiologically split.  There is no murmur gallop rub or click.  There is no abnormal lift or heave.  The abdomen is soft and nontender.  The bowel sounds are normal.  The liver and spleen are not enlarged.  There are no abdominal masses.  There are no abdominal bruits.  Extremities reveal good pedal pulses.  There is no phlebitis or edema.  There is no cyanosis or clubbing.  Strength is normal and symmetrical in all extremities.  There is no lateralizing weakness.  There are no sensory deficits.  The skin is warm and dry.  There is no rash.      Assessment / Plan: Continue same medication.  Recheck in 4 months for followup office visit lipid panel hepatic function panel and basal metabolic panel.

## 2012-03-06 NOTE — Patient Instructions (Signed)
Your physician recommends that you continue on your current medications as directed. Please refer to the Current Medication list given to you today. Your physician wants you to follow-up in: 4 months You will receive a reminder letter in the mail two months in advance. If you don't receive a letter, please call our office to schedule the follow-up appointment.  

## 2012-03-06 NOTE — Assessment & Plan Note (Signed)
Blood pressure is staying normal on current therapy.  No side effects from his medicines.

## 2012-04-30 ENCOUNTER — Other Ambulatory Visit: Payer: Self-pay | Admitting: Cardiology

## 2012-06-23 ENCOUNTER — Other Ambulatory Visit: Payer: Self-pay | Admitting: Cardiovascular Disease

## 2012-07-05 ENCOUNTER — Other Ambulatory Visit: Payer: Self-pay | Admitting: Cardiovascular Disease

## 2013-06-25 ENCOUNTER — Other Ambulatory Visit: Payer: Self-pay | Admitting: Cardiovascular Disease

## 2013-06-27 ENCOUNTER — Telehealth: Payer: Self-pay | Admitting: Cardiology

## 2013-06-27 MED ORDER — HYDROCHLOROTHIAZIDE 25 MG PO TABS: ORAL_TABLET | ORAL | Status: DC

## 2013-06-27 MED ORDER — LISINOPRIL 20 MG PO TABS: ORAL_TABLET | ORAL | Status: DC

## 2013-06-27 NOTE — Telephone Encounter (Signed)
New Prob  Pt grandson called in stating grandfather needs a refill of his BP medicine.  He was unsure of the medication name.

## 2013-06-27 NOTE — Telephone Encounter (Signed)
Tried to call patient to schedule ov before refilling medication. Number listed wrong number. Filled Rx's requested x 1 with note no more refills until seen

## 2013-07-10 ENCOUNTER — Other Ambulatory Visit: Payer: Self-pay | Admitting: Cardiology

## 2013-07-28 ENCOUNTER — Inpatient Hospital Stay (HOSPITAL_COMMUNITY): Payer: Self-pay

## 2013-07-28 ENCOUNTER — Observation Stay (HOSPITAL_COMMUNITY)
Admission: EM | Admit: 2013-07-28 | Discharge: 2013-07-30 | Disposition: A | Discharge status: Home / Self Care | Payer: MEDICAID | Attending: Internal Medicine | Admitting: Internal Medicine

## 2013-07-28 ENCOUNTER — Encounter (HOSPITAL_COMMUNITY): Payer: Self-pay | Admitting: *Deleted

## 2013-07-28 DIAGNOSIS — I498 Other specified cardiac arrhythmias: Principal | ICD-10-CM | POA: Insufficient documentation

## 2013-07-28 DIAGNOSIS — E119 Type 2 diabetes mellitus without complications: Secondary | ICD-10-CM

## 2013-07-28 DIAGNOSIS — R001 Bradycardia, unspecified: Secondary | ICD-10-CM

## 2013-07-28 DIAGNOSIS — Z8673 Personal history of transient ischemic attack (TIA), and cerebral infarction without residual deficits: Secondary | ICD-10-CM

## 2013-07-28 DIAGNOSIS — G459 Transient cerebral ischemic attack, unspecified: Secondary | ICD-10-CM

## 2013-07-28 DIAGNOSIS — R7303 Prediabetes: Secondary | ICD-10-CM | POA: Diagnosis present

## 2013-07-28 DIAGNOSIS — R200 Anesthesia of skin: Secondary | ICD-10-CM

## 2013-07-28 DIAGNOSIS — I10 Essential (primary) hypertension: Secondary | ICD-10-CM

## 2013-07-28 DIAGNOSIS — I2510 Atherosclerotic heart disease of native coronary artery without angina pectoris: Secondary | ICD-10-CM

## 2013-07-28 DIAGNOSIS — R209 Unspecified disturbances of skin sensation: Secondary | ICD-10-CM | POA: Insufficient documentation

## 2013-07-28 DIAGNOSIS — E785 Hyperlipidemia, unspecified: Secondary | ICD-10-CM

## 2013-07-28 DIAGNOSIS — Z9861 Coronary angioplasty status: Secondary | ICD-10-CM | POA: Insufficient documentation

## 2013-07-28 DIAGNOSIS — R42 Dizziness and giddiness: Secondary | ICD-10-CM

## 2013-07-28 LAB — COMPREHENSIVE METABOLIC PANEL
AST: 23 U/L (ref 0–37)
Albumin: 3.9 g/dL (ref 3.5–5.2)
Alkaline Phosphatase: 56 U/L (ref 39–117)
BUN: 22 mg/dL (ref 6–23)
CO2: 26 mEq/L (ref 19–32)
Chloride: 102 mEq/L (ref 96–112)
Creatinine, Ser: 0.96 mg/dL (ref 0.50–1.35)
GFR calc non Af Amer: 86 mL/min — ABNORMAL LOW (ref >90)
Potassium: 4.3 mEq/L (ref 3.5–5.1)
Total Bilirubin: 0.4 mg/dL (ref 0.3–1.2)

## 2013-07-28 LAB — GLUCOSE, CAPILLARY
Glucose-Capillary: 140 mg/dL — ABNORMAL HIGH (ref 70–99)
Glucose-Capillary: 72 mg/dL (ref 70–99)

## 2013-07-28 LAB — CBC WITH DIFFERENTIAL/PLATELET
Basophils Absolute: 0 10*3/uL (ref 0.0–0.1)
Basophils Relative: 0 % (ref 0–1)
HCT: 40.4 % (ref 39.0–52.0)
Hemoglobin: 14.6 g/dL (ref 13.0–17.0)
Lymphocytes Relative: 20 % (ref 12–46)
MCHC: 36.1 g/dL — ABNORMAL HIGH (ref 30.0–36.0)
Monocytes Absolute: 0.6 10*3/uL (ref 0.1–1.0)
Monocytes Relative: 9 % (ref 3–12)
Neutro Abs: 4.7 10*3/uL (ref 1.7–7.7)
Neutrophils Relative %: 69 % (ref 43–77)
WBC: 6.8 10*3/uL (ref 4.0–10.5)

## 2013-07-28 LAB — RAPID URINE DRUG SCREEN, HOSP PERFORMED
Amphetamines: NOT DETECTED
Cocaine: NOT DETECTED
Opiates: NOT DETECTED

## 2013-07-28 LAB — URINALYSIS W MICROSCOPIC + REFLEX CULTURE
Bilirubin Urine: NEGATIVE
Glucose, UA: NEGATIVE mg/dL
Hgb urine dipstick: NEGATIVE
Ketones, ur: NEGATIVE mg/dL
Leukocytes, UA: NEGATIVE
Nitrite: NEGATIVE
Protein, ur: NEGATIVE mg/dL
Specific Gravity, Urine: 1.007 (ref 1.005–1.030)
Urobilinogen, UA: 0.2 mg/dL (ref 0.0–1.0)
pH: 6.5 (ref 5.0–8.0)

## 2013-07-28 MED ORDER — ASPIRIN 325 MG PO TABS
325.0000 mg | ORAL_TABLET | Freq: Every day | ORAL | Status: DC
  Administered 2013-07-28 – 2013-07-30 (×3): 325 mg via ORAL
  Filled 2013-07-28 (×3): qty 1

## 2013-07-28 MED ORDER — GADOBENATE DIMEGLUMINE 529 MG/ML IV SOLN
20.0000 mL | Freq: Once | INTRAVENOUS | Status: AC | PRN
  Administered 2013-07-28: 20 mL via INTRAVENOUS

## 2013-07-28 MED ORDER — NITROGLYCERIN 0.4 MG SL SUBL: 0.4000 mg | SUBLINGUAL_TABLET | SUBLINGUAL | Status: DC | PRN

## 2013-07-28 MED ORDER — SIMVASTATIN 40 MG PO TABS
40.0000 mg | ORAL_TABLET | Freq: Every day | ORAL | Status: DC
  Administered 2013-07-28 – 2013-07-29 (×2): 40 mg via ORAL
  Filled 2013-07-28 (×3): qty 1

## 2013-07-28 MED ORDER — SENNOSIDES-DOCUSATE SODIUM 8.6-50 MG PO TABS: 1.0000 | ORAL_TABLET | Freq: Every evening | ORAL | Status: DC | PRN

## 2013-07-28 MED ORDER — ENOXAPARIN SODIUM 40 MG/0.4ML ~~LOC~~ SOLN
40.0000 mg | SUBCUTANEOUS | Status: DC
  Administered 2013-07-28 – 2013-07-29 (×2): 40 mg via SUBCUTANEOUS
  Filled 2013-07-28 (×3): qty 0.4

## 2013-07-28 MED ORDER — ASPIRIN 300 MG RE SUPP
300.0000 mg | Freq: Once | RECTAL | Status: DC
  Filled 2013-07-28: qty 1

## 2013-07-28 MED ORDER — INSULIN ASPART 100 UNIT/ML ~~LOC~~ SOLN: 0.0000 [IU] | Freq: Three times a day (TID) | SUBCUTANEOUS | Status: DC

## 2013-07-28 MED ORDER — SODIUM CHLORIDE 0.9 % IV SOLN
INTRAVENOUS | Status: DC
  Administered 2013-07-28: 21:00:00 via INTRAVENOUS

## 2013-07-28 NOTE — H&P (Signed)
Date: 07/28/2013               Patient Name:  Phillip Wells MRN: [id number]  DOB: 1951-09-18 Age / Sex: 62 y.o., male   PCP: No Pcp Per Patient         Medical Service: Internal Medicine Teaching Service         Attending Physician: Dr. Layla Maw Ward, DO    First Contact: Dr. Evelena Peat Pager: [phone]  Second Contact: Dr. Lorretta Harp Pager: [phone]       After Hours (After 5p/  First Contact Pager: [phone]  weekends / holidays): Second Contact Pager: [phone]   Chief Complaint: dizziness  History of Present Illness: [name] is a 62 yo male with a PMH of CAD (s/p MI), HTN, HLD, incomplete RBBB and DM type 2.  He presents to the ED after experiencing an episode of dizziness and numbness/tingling of his tongue and feet when he awoke this morning.  He felt normal when he awoke but around 5AM he began to feel dizzy and had generalized weakness.  He stood up to go to the bathroom but felt as if his feet could not support him.  He also felt nauseous.  He denies the sensations of himself spinning, the room spinning or feeling as if he was going to pass out.  He CP/palpitations/SOB/vision loss.  He did not feel weak no a particular side of his body but says his feet felt numb (R>L) for several days prior to this episode.  Meds: No current facility-administered medications for this encounter.   Current Outpatient Prescriptions  Medication Sig Dispense Refill  . aspirin 325 MG tablet Take 325 mg by mouth daily.        Marland Kitchen diltiazem (CARDIZEM) 90 MG tablet Take 90 mg by mouth 2 (two) times daily.      . hydrochlorothiazide (HYDRODIURIL) 25 MG tablet Take 25 mg by mouth daily.      Marland Kitchen lisinopril (PRINIVIL,ZESTRIL) 20 MG tablet Take 20 mg by mouth daily.      . metoprolol (LOPRESSOR) 50 MG tablet Take 50 mg by mouth 2 (two) times daily.      . nitroGLYCERIN (NITROSTAT) 0.4 MG SL tablet Place 0.4 mg under the tongue every 5 (five) minutes as needed for chest pain.       .  pravastatin (PRAVACHOL) 80 MG tablet Take 80 mg by mouth daily.        Allergies: Allergies as of 07/28/2013  . (No Known Allergies)   Past Medical History  Diagnosis Date  . Coronary artery disease     s/p DES to the LAD  . HTN (hypertension)   . Hyperlipemia   . Myocardial infarction 11/09/10  . Incomplete RBBB    Past Surgical History  Procedure Laterality Date  . Coronary stent placement  November 2011    DES to LAD   Family History  Problem Relation Age of Onset  . Heart disease Neg Hx    History   Social History  . Marital Status: Married    Spouse Name: N/A    Number of Children: N/A  . Years of Education: N/A   Occupational History  . Not on file.   Social History Main Topics  . Smoking status: Former Smoker    Types: Cigarettes    Quit date: 01/31/1981  . Smokeless tobacco: Not on file  . Alcohol Use: No  . Drug Use: No  . Sexual Activity:  Other Topics Concern  . Not on file   Social History Narrative  . No narrative on file    Review of Systems: Pertinent items are noted in HPI.  Physical Exam: Blood pressure 126/63, pulse 48, temperature 98 F (36.7 C), temperature source Oral, resp. rate 14, SpO2 99.00%. General: resting in bed in NAD; family members at beside HEENT: PERRL, EOMI, no scleral icterus Cardiac: regular rhythm, bradycardic, no rubs, murmurs or gallops Pulm: clear to auscultation bilaterally, moving normal volumes of air Abd: soft, nontender, nondistended Ext: warm and well perfused, no pedal edema, +2DPs Neuro: alert and oriented X3, cranial nerves II-XII grossly intact, 5/5 MMS, babinski negative, rapid hand coordination and finger-to-nose intact   Lab results: Basic Metabolic Panel:  Recent Labs  81/19/14 0905  NA 137  K 4.3  CL 102  CO2 26  GLUCOSE 133*  BUN 22  CREATININE 0.96  CALCIUM 9.3   Liver Function Tests:  Recent Labs  07/28/13 0905  AST 23  ALT 17  ALKPHOS 56  BILITOT 0.4  PROT 7.1    ALBUMIN 3.9   CBC:  Recent Labs  07/28/13 0905  WBC 6.8  NEUTROABS 4.7  HGB 14.6  HCT 40.4  MCV 89.4  PLT 214   CBG:  Recent Labs  07/28/13 0838  GLUCAP 140*   Urinalysis:  Recent Labs  07/28/13 1117  COLORURINE YELLOW  LABSPEC 1.007  PHURINE 6.5  GLUCOSEU NEGATIVE  HGBUR NEGATIVE  BILIRUBINUR NEGATIVE  KETONESUR NEGATIVE  PROTEINUR NEGATIVE  UROBILINOGEN 0.2  NITRITE NEGATIVE  LEUKOCYTESUR NEGATIVE    Imaging results:  No results found.   Assessment & Plan by Problem: 62 year old man with a past medical history of hypertension, hyperlipidemia, myocardial infarction (S./P. stent placement in 2011), diabetes, who presents with a dizziness and tougue numbness. His symptoms completely resolved after several hours.   #: Dizziness: Given his significant risk factors, TIA was high on differential, however, MRI/MRA negative.  The other differential diagnoses include drug induced bradycardia as patient is on high dose cardizem and metoprolol; peripheral vertigo;  including Mnire's disease (less likely given no tinnitus and hearing loss), benign paroxysmal positional vertigo (possible, patient reports dizziness on position change), vestibular neuritis (less likely, patient denies recent viral illness) and vestibular schwannoma (unlikely, given sudden onset). Other possibility is drug induced vertigo and orthostatic status. Patient denies having chest pain currently. Patient's BMP is normal, ruling out electrolyte disturbance as the etiology.  - admit to tele bed  - 2D-Echo   - Fall precaution  - NPO until swallow evaluation  - patient was already taking aspirin regularly. Will change from aspirin to clopidogrel for stroke prophylaxis. If MRI has no hemorrhagic stroke.  - Lab include A1C, Cycles cardiac enzymes, fasting lipid, UDS   #: DM-II: A1c was 6.3 on 3/12. Not on medicaitons.  -will check A1c  -SSI   #: HTN: bp 119/69 mmHg.  -will hold bp medication in the  setting of TIA   #: CAD: has hx of MI. S/p drug-eluting stent placement to his LAD in 2011. Stable. No chest pain. EKG no new change.  - Trop q6h  - continue metoprolol and hold other bp meds.   #: DVT PPx: SQ Lovenox   EKG:  Sinus bradycardia  Dispo: Disposition is deferred at this time, awaiting improvement of current medical problems. Anticipated discharge in approximately 2-3 day(s).   The patient does not have a current PCP (No Pcp Per Patient) and does not need an  [facility] hospital follow-up appointment after discharge.  The patient does not know have transportation limitations that hinder transportation to clinic appointments.  Signed: Evelena Peat, DO 07/28/2013, 2:16 PM

## 2013-07-28 NOTE — ED Notes (Signed)
Internal medicine at bedside

## 2013-07-28 NOTE — ED Provider Notes (Signed)
Medical screening examination/treatment/procedure(s) were performed by non-physician practitioner and as supervising physician I was immediately available for consultation/collaboration.  Layla Maw Eh Sesay, DO 07/28/13 1709

## 2013-07-28 NOTE — ED Provider Notes (Signed)
CSN: [id number]     Arrival date & time 07/28/13  0865 History     First MD Initiated Contact with Patient 07/28/13 [phone]     Chief Complaint  Patient presents with  . Dizziness   (Consider location/radiation/quality/duration/timing/severity/associated sxs/prior Treatment) HPI Patient is a 62 year old Hispanic male who presented to emergency department with complaint of dizziness. Patient states he woke up this morning and turned to the side to look at his alarm clock when he suddenly became dizzy and shaky. Patient states he then got up to go to the bathroom was feeling okay however one would back to lay down stay at dizziness started again. Patient states no symptoms while sitting up or standing up however every time he laid down flat he began feeling like he was dizzy again. Patient was asked what his dizziness felt like however he is unable to describe it and unable to confirm that everything was spinning around versus he just felt lightheaded. Patient denied any associated symptoms. PT states he did feel nauseated, but denied any chest pain shortness of breath,vomiting, changes in bowels patient has not had any recent illnesses. Patient states he took his morning medications after his symptoms began and he felt better. Patient denies any current symptoms. Past Medical History  Diagnosis Date  . Coronary artery disease     s/p DES to the LAD  . HTN (hypertension)   . Hyperlipemia   . Myocardial infarction 11/09/10  . Incomplete RBBB    Past Surgical History  Procedure Laterality Date  . Coronary stent placement  November 2011    DES to LAD   Family History  Problem Relation Age of Onset  . Heart disease Neg Hx    History  Substance Use Topics  . Smoking status: Former Smoker    Types: Cigarettes    Quit date: 01/31/1981  . Smokeless tobacco: Not on file  . Alcohol Use: No    Review of Systems  Constitutional: Negative for fever and chills.  HENT: Negative for neck pain  and neck stiffness.   Respiratory: Negative.   Cardiovascular: Negative.   Gastrointestinal: Negative.   Genitourinary: Negative.   Musculoskeletal: Negative.   Neurological: Positive for dizziness and light-headedness. Negative for weakness, numbness and headaches.    Allergies  Review of patient's allergies indicates no known allergies.  Home Medications   Current Outpatient Rx  Name  Route  Sig  Dispense  Refill  . aspirin 325 MG tablet   Oral   Take 325 mg by mouth daily.           Marland Kitchen diltiazem (CARDIZEM) 90 MG tablet   Oral   Take 90 mg by mouth 2 (two) times daily.         . hydrochlorothiazide (HYDRODIURIL) 25 MG tablet   Oral   Take 25 mg by mouth daily.         Marland Kitchen lisinopril (PRINIVIL,ZESTRIL) 20 MG tablet   Oral   Take 20 mg by mouth daily.         . metoprolol (LOPRESSOR) 50 MG tablet   Oral   Take 50 mg by mouth 2 (two) times daily.         . pravastatin (PRAVACHOL) 80 MG tablet   Oral   Take 80 mg by mouth daily.         . nitroGLYCERIN (NITROSTAT) 0.4 MG SL tablet   Sublingual   Place 0.4 mg under the tongue every 5 (five) minutes as needed.  BP 161/80  Pulse 62  Temp(Src) 98.4 F (36.9 C) (Oral)  Resp 20  SpO2 97% Physical Exam  Nursing note and vitals reviewed. Constitutional: He is oriented to person, place, and time. He appears well-developed and well-nourished. No distress.  Eyes: Conjunctivae are normal.  Neck: Neck supple.  Cardiovascular: Normal rate, regular rhythm and normal heart sounds.   Pulmonary/Chest: Effort normal and breath sounds normal. No respiratory distress. He has no wheezes. He has no rales.  Abdominal: Soft. Bowel sounds are normal. He exhibits no distension. There is no tenderness. There is no rebound.  Musculoskeletal: He exhibits no edema.  Neurological: He is alert and oriented to person, place, and time.  Skin: Skin is warm and dry.    ED Course   Procedures (including critical care  time)   Date: 07/28/2013  Rate: 47  Rhythm: sinus bradycardia  QRS Axis: normal  Intervals: normal  ST/T Wave abnormalities: nonspecific ST changes  Conduction Disutrbances:none  Narrative Interpretation:   Old EKG Reviewed: unchanged   Labs Reviewed  GLUCOSE, CAPILLARY - Abnormal; Notable for the following:    Glucose-Capillary 140 (*)    All other components within normal limits  CBC WITH DIFFERENTIAL  COMPREHENSIVE METABOLIC PANEL  URINALYSIS W MICROSCOPIC + REFLEX CULTURE   Dg Chest 2 View  07/28/2013   *RADIOLOGY REPORT*  Clinical Data: TIA; history of hypertension  CHEST - 2 VIEW  Comparison: November 09, 2010  Findings: The degree of inspiration is shallow.  No edema or consolidation.  Heart is upper normal in size with normal pulmonary vascularity.  No adenopathy.  No bone lesions.  IMPRESSION: No edema or consolidation.   Original Report Authenticated By: Bretta Bang, M.D.   Mr [facility] Wo Contrast  07/28/2013   *RADIOLOGY REPORT*  Clinical Data:  Dizziness  MRI HEAD WITHOUT AND WITH CONTRAST MRA HEAD WITHOUT CONTRAST MRA NECK WITHOUT AND WITH CONTRAST  Technique:  Multiplanar, multiecho pulse sequences of the brain and surrounding structures were obtained without and with intravenous contrast.  Angiographic images of the Circle of Willis were obtained using MRA technique without intravenous contrast. Angiographic images of the neck were obtained using MRA technique without and with intravenous contrast.  Carotid stenosis measurements (when applicable) are obtained utilizing NASCET criteria, using the distal internal carotid diameter as the denominator.  Contrast: 20mL MULTIHANCE GADOBENATE DIMEGLUMINE 529 MG/ML IV SOLN  Comparison:   None.  MRI HEAD  Findings:  Diffusion imaging does not show any acute or subacute infarction.  The brainstem and cerebellum are normal.  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  No cortical or large  vessel territory infarction.  No mass lesion, hemorrhage, hydrocephalus or extra-axial collection.  No pituitary mass.  No fluid in the sinuses, middle ears or mastoids. No skull or skull base lesion.  IMPRESSION: No acute or significant finding.  Minimal small vessel change of the hemispheric white matter.  MRA HEAD  Findings: Both internal carotid arteries are widely patent into the brain.  The anterior and middle cerebral vessels are patent without proximal stenosis, aneurysm or vascular malformation.  Both vertebral arteries are widely patent to the basilar.  No basilar stenosis.  Posterior circulation branch vessels are normal.  IMPRESSION: Normal intracranial MR angiography of the large and medium-sized vessels.  MRA NECK  Findings: Branching pattern of the brachiocephalic vessels from the arch is normal with the exception that the left vertebral artery arises directly from the aortic  arch.  No origin stenoses.  Both common carotid arteries are widely patent to the bifurcation.  No stenosis or irregularity at either carotid bifurcation.  Both cervical internal carotid arteries are normal.  Both vertebral arteries are widely patent to.  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  IMPRESSION: No stenotic or occlusive disease of the neck vessels.   Original Report Authenticated By: Paulina Fusi, M.D.   Mr Angiogram Neck W Wo Contrast  07/28/2013   *RADIOLOGY REPORT*  Clinical Data:  Dizziness  MRI HEAD WITHOUT AND WITH CONTRAST MRA HEAD WITHOUT CONTRAST MRA NECK WITHOUT AND WITH CONTRAST  Technique:  Multiplanar, multiecho pulse sequences of the brain and surrounding structures were obtained without and with intravenous contrast.  Angiographic images of the Circle of Willis were obtained using MRA technique without intravenous contrast. Angiographic images of the neck were obtained using MRA technique without and with intravenous contrast.  Carotid stenosis measurements (when  applicable) are obtained utilizing NASCET criteria, using the distal internal carotid diameter as the denominator.  Contrast: 20mL MULTIHANCE GADOBENATE DIMEGLUMINE 529 MG/ML IV SOLN  Comparison:   None.  MRI HEAD  Findings:  Diffusion imaging does not show any acute or subacute infarction.  The brainstem and cerebellum are normal.  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  No cortical or large vessel territory infarction.  No mass lesion, hemorrhage, hydrocephalus or extra-axial collection.  No pituitary mass.  No fluid in the sinuses, middle ears or mastoids. No skull or skull base lesion.  IMPRESSION: No acute or significant finding.  Minimal small vessel change of the hemispheric white matter.  MRA HEAD  Findings: Both internal carotid arteries are widely patent into the brain.  The anterior and middle cerebral vessels are patent without proximal stenosis, aneurysm or vascular malformation.  Both vertebral arteries are widely patent to the basilar.  No basilar stenosis.  Posterior circulation branch vessels are normal.  IMPRESSION: Normal intracranial MR angiography of the large and medium-sized vessels.  MRA NECK  Findings: Branching pattern of the brachiocephalic vessels from the arch is normal with the exception that the left vertebral artery arises directly from the aortic arch.  No origin stenoses.  Both common carotid arteries are widely patent to the bifurcation.  No stenosis or irregularity at either carotid bifurcation.  Both cervical internal carotid arteries are normal.  Both vertebral arteries are widely patent to.  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  IMPRESSION: No stenotic or occlusive disease of the neck vessels.   Original Report Authenticated By: Paulina Fusi, M.D.   Mr [facility] Wo Contrast  07/28/2013   *RADIOLOGY REPORT*  Clinical Data:  Dizziness  MRI HEAD WITHOUT AND WITH CONTRAST MRA HEAD WITHOUT CONTRAST  MRA NECK WITHOUT AND WITH CONTRAST  Technique:  Multiplanar, multiecho pulse sequences of the brain and surrounding structures were obtained without and with intravenous contrast.  Angiographic images of the Circle of Willis were obtained using MRA technique without intravenous contrast. Angiographic images of the neck were obtained using MRA technique without and with intravenous contrast.  Carotid stenosis measurements (when applicable) are obtained utilizing NASCET criteria, using the distal internal carotid diameter as the denominator.  Contrast: 20mL MULTIHANCE GADOBENATE DIMEGLUMINE 529 MG/ML IV SOLN  Comparison:   None.  MRI HEAD  Findings:  Diffusion imaging does not show any acute or subacute infarction.  The brainstem and cerebellum are  normal.  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  No cortical or large vessel territory infarction.  No mass lesion, hemorrhage, hydrocephalus or extra-axial collection.  No pituitary mass.  No fluid in the sinuses, middle ears or mastoids. No skull or skull base lesion.  IMPRESSION: No acute or significant finding.  Minimal small vessel change of the hemispheric white matter.  MRA HEAD  Findings: Both internal carotid arteries are widely patent into the brain.  The anterior and middle cerebral vessels are patent without proximal stenosis, aneurysm or vascular malformation.  Both vertebral arteries are widely patent to the basilar.  No basilar stenosis.  Posterior circulation branch vessels are normal.  IMPRESSION: Normal intracranial MR angiography of the large and medium-sized vessels.  MRA NECK  Findings: Branching pattern of the brachiocephalic vessels from the arch is normal with the exception that the left vertebral artery arises directly from the aortic arch.  No origin stenoses.  Both common carotid arteries are widely patent to the bifurcation.  No stenosis or irregularity at either carotid bifurcation.  Both cervical  internal carotid arteries are normal.  Both vertebral arteries are widely patent to.  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  IMPRESSION: No stenotic or occlusive disease of the neck vessels.   Original Report Authenticated By: Paulina Fusi, M.D.   1. Numbness of tongue   2. Dizziness     MDM  Patient with several episodes of dizziness this morning and numbness which all now resolved. Patient's workup in the emergency department is unremarkable other than he was found to have bradycardia, which is most likely due to his Lopressor. EKG shows sinus bradycardia. Patient has multiple risk factors for possible CVA versus TIA area patient has history of coronary disease and prior MIs, he has hypertension, hyperlipidemia, diabetes. Patient does not have a primary care doctor or close followup. Given his symptoms could be do to a TIA will call for the medical admission for a TIA workup. Spoke with internal medicine teaching service they will come and admit patient  Filed Vitals:   07/28/13 1300 07/28/13 1315 07/28/13 1330 07/28/13 1630  BP: 123/69 114/61 126/63 120/74  Pulse: 44 41 48 50  Temp:    97.4 F (36.3 C)  TempSrc:    Oral  Resp: 14 18 14 16   SpO2: 97% 96% 99% 97%      Lottie Mussel, PA-C 07/28/13 1648

## 2013-07-28 NOTE — ED Notes (Signed)
Pt knows that urine is needed. Pt unable to void at this time. Urinal is at bed side 

## 2013-07-28 NOTE — ED Notes (Signed)
Used translator phones, pt reports waking up this am 0500 and was feeling drowsy, dizzy and shaky. No neuro deficits noted at triage, ambulatory, speech clear, a&ox4.

## 2013-07-29 DIAGNOSIS — R42 Dizziness and giddiness: Secondary | ICD-10-CM

## 2013-07-29 LAB — CBC WITH DIFFERENTIAL/PLATELET
Basophils Relative: 0 % (ref 0–1)
Eosinophils Absolute: 0.1 10*3/uL (ref 0.0–0.7)
Eosinophils Relative: 2 % (ref 0–5)
Hemoglobin: 14.1 g/dL (ref 13.0–17.0)
Lymphs Abs: 1.6 10*3/uL (ref 0.7–4.0)
MCH: 31.5 pg (ref 26.0–34.0)
MCHC: 35.4 g/dL (ref 30.0–36.0)
MCV: 89 fL (ref 78.0–100.0)
Monocytes Relative: 10 % (ref 3–12)
RBC: 4.47 MIL/uL (ref 4.22–5.81)

## 2013-07-29 LAB — GLUCOSE, CAPILLARY: Glucose-Capillary: 121 mg/dL — ABNORMAL HIGH (ref 70–99)

## 2013-07-29 LAB — BASIC METABOLIC PANEL
BUN: 15 mg/dL (ref 6–23)
Calcium: 9 mg/dL (ref 8.4–10.5)
Creatinine, Ser: 1.01 mg/dL (ref 0.50–1.35)
GFR calc non Af Amer: 77 mL/min — ABNORMAL LOW (ref >90)
Glucose, Bld: 95 mg/dL (ref 70–99)

## 2013-07-29 LAB — LIPID PANEL
HDL: 39 mg/dL — ABNORMAL LOW (ref >39)
LDL Cholesterol: 98 mg/dL (ref 0–99)

## 2013-07-29 MED ORDER — METOPROLOL TARTRATE 25 MG PO TABS
25.0000 mg | ORAL_TABLET | Freq: Two times a day (BID) | ORAL | Status: DC
  Administered 2013-07-29 – 2013-07-30 (×2): 25 mg via ORAL
  Filled 2013-07-29 (×3): qty 1

## 2013-07-29 NOTE — Progress Notes (Signed)
SLP Cancellation Note  Patient Details Name: Phillip Wells MRN: [id number] DOB: 07/19/1951   Cancelled treatment:       Reason Eval/Treat Not Completed:  (screened). Pt and SLP discussed history in Spanish, no cognitive linguistic deficits observed; MRI is negative, family without concerns. Pt also passed RN stroke swallow and has tolerated diet. SLP will defer evaluations at this time.   Harlon Ditty, [place] CCC-SLP [phone]  Claudine Mouton 07/29/2013, 3:49 PM

## 2013-07-29 NOTE — Evaluation (Signed)
Physical Therapy Evaluation Patient Details Name: Phillip Wells MRN: [id number] DOB: 11-15-1951 Today's Date: 07/29/2013 Time: 5621-3086 PT Time Calculation (min): 11 min  PT Assessment / Plan / Recommendation History of Present Illness  Patient is a 62 year old Hispanic male who presented to emergency department with complaint of dizziness.  Patient being worked-up for TIA.  MRI negative.  Clinical Impression  Patient is independent in all mobility and gait.  Scored 24/24 on DGI balance assessment - no fall risk.  Patient reports no dizziness at any time during PT evaluation.  No acute PT needs identified - PT will sign off.  Encouraged ambulation in hallway.    PT Assessment  Patent does not need any further PT services    Follow Up Recommendations  No PT follow up;Supervision - Intermittent    Does the patient have the potential to tolerate intense rehabilitation      Barriers to Discharge        Equipment Recommendations  None recommended by PT    Recommendations for Other Services     Frequency      Precautions / Restrictions Precautions Precautions: None Restrictions Weight Bearing Restrictions: No   Pertinent Vitals/Pain       Mobility  Bed Mobility Bed Mobility: Supine to Sit;Sit to Supine Supine to Sit: 7: Independent;HOB flat Sit to Supine: 7: Independent;HOB flat Details for Bed Mobility Assistance: No dizziness reported with changes in position. Transfers Transfers: Sit to Stand;Stand to Sit Sit to Stand: 7: Independent;From bed Stand to Sit: 7: Independent;To bed Details for Transfer Assistance: No cues needed.  Good standing balance.  No dizziness with standing. Ambulation/Gait Ambulation/Gait Assistance: 7: Independent Ambulation Distance (Feet): 200 Feet Assistive device: None Ambulation/Gait Assistance Details: Patient with good gait pattern, balance, and speed with ambulation.  No dizziness with gait. Gait Pattern: Within Functional  Limits Gait velocity: WFL Stairs: Yes Stairs Assistance: 5: Supervision Stair Management Technique: No rails;Alternating pattern;Forwards Number of Stairs: 5 (x2) Modified Rankin (Stroke Patients Only) Pre-Morbid Rankin Score: No symptoms Modified Rankin: No symptoms        PT Goals(Current goals can be found in the care plan section)  N/A  Visit Information  Last PT Received On: 07/29/13 Assistance Needed: +1 History of Present Illness: Patient is a 61 year old Hispanic male who presented to emergency department with complaint of dizziness.  Patient being worked-up for TIA.  MRI negative.       Prior Functioning  Home Living Family/patient expects to be discharged to:: Private residence Living Arrangements: Spouse/significant other;Children Available Help at Discharge: Family;Available 24 hours/day Type of Home: House Home Access: Stairs to enter [employer] of Steps: 3 Entrance Stairs-Rails: Right;Left Home Layout: One level Home Equipment: Crutches Prior Function Level of Independence: Independent Communication Communication: Prefers language other than English;Interpreter utilized (Daughter and grandson interpreted)    Cognition  Cognition Arousal/Alertness: Awake/alert Behavior During Therapy: WFL for tasks assessed/performed Overall Cognitive Status: Within Functional Limits for tasks assessed    Extremity/Trunk Assessment Upper Extremity Assessment Upper Extremity Assessment: Overall WFL for tasks assessed Lower Extremity Assessment Lower Extremity Assessment: Overall WFL for tasks assessed (Prior injury to right ankle prevented single limb stance) Cervical / Trunk Assessment Cervical / Trunk Assessment: Normal   Balance Balance Balance Assessed: Yes Standardized Balance Assessment Standardized Balance Assessment: Dynamic Gait Index Dynamic Gait Index Level Surface: Normal Change in Gait Speed: Normal Gait with Horizontal Head Turns:  Normal Gait with Vertical Head Turns: Normal Gait and Pivot Turn: Normal Step Over Obstacle:  Normal Step Around Obstacles: Normal Steps: Normal Total Score: 24  End of Session PT - End of Session Activity Tolerance: Patient tolerated treatment well Patient left: in bed;with call bell/phone within reach;with family/visitor present Nurse Communication: Mobility status  GP Functional Assessment Tool Used: DGI balance assessment and clinical judgement Functional Limitation: Mobility: Walking and moving around Mobility: Walking and Moving Around Current Status (Z6109): 0 percent impaired, limited or restricted Mobility: Walking and Moving Around Goal Status (U0454): 0 percent impaired, limited or restricted Mobility: Walking and Moving Around Discharge Status [phone]): 0 percent impaired, limited or restricted   Phillip Wells 07/29/2013, 1:48 PM Phillip Wells, Phillip Wells [facility] Pager [phone]

## 2013-07-29 NOTE — Progress Notes (Signed)
OT Discharge Note  Patient Details Name: Phillip Wells MRN: [id number] DOB: 09-25-1951   Cancelled Treatment:    Reason Eval/Treat Not Completed: OT screened, no needs identified, will sign off. Family in room to translate questions about vision and UE strength. Family and patient with no questions or concerns. Spoke with PT Darl Pikes and confirmed no Ot needs.  Harrel Carina [place] Pager: [phone]  07/29/2013, 3:20 PM

## 2013-07-29 NOTE — H&P (Signed)
Internal Medicine Attending Admission Note Date: 07/29/2013  Patient name: Phillip Wells Medical record number: [id number] Date of birth: 1951/03/06 Age: 62 y.o. Gender: male  I saw and evaluated the patient. I reviewed the resident's note and I agree with the resident's findings and plan as documented in the resident's note, with the following additional comments.  Chief Complaint(s): Episode of dizziness with numbness/tingling of his tongue and feet  History - key components related to admission: Patient is a 63 year old man with history of coronary artery disease status post LAD stent in 2011, hypertension, hyperlipidemia, diet-controlled diabetes mellitus, and other problems as outlined in the medical history admitted following an episode of dizziness and numbness/tingling of his tongue and feet which occurred while lying down at home.  He also felt generally weak and felt that his legs would not support him when he stood to go to the bathroom.  He denies any vertigo or presyncope.  He also denies any associated chest pain, palpitations, or shortness of breath.  He has had no further symptoms since hospital admission, and is asymptomatic today.   Physical Exam - key components related to admission:  Filed Vitals:   07/29/13 0237 07/29/13 0500 07/29/13 1043 07/29/13 1338  BP: 114/70 142/76 147/84 150/85  Pulse: 76 55 61 62  Temp: 98 F (36.7 C) 97.9 F (36.6 C) 98.4 F (36.9 C) 98.5 F (36.9 C)  TempSrc: Oral Oral Oral Oral  Resp: 22 22 20 20   Height:      Weight:      SpO2: 97% 97% 98% 100%    General: Alert, no distress Lungs: Clear Heart: Regular; S1-S2, no S3, no S4, no murmurs Abdomen bowel sounds present, soft, nontender; no hepatosplenomegaly Extremities: No edema Neurologic cranial nerves II through XII intact; motor 5 over 5 throughout; sensation intact to light touch; nonfocal   Lab results:   Basic Metabolic Panel:  Recent Labs  40/98/11 0905  07/29/13 0550  NA 137 138  K 4.3 3.6  CL 102 102  CO2 26 26  GLUCOSE 133* 95  BUN 22 15  CREATININE 0.96 1.01  CALCIUM 9.3 9.0   Liver Function Tests:  Recent Labs  07/28/13 0905  AST 23  ALT 17  ALKPHOS 56  BILITOT 0.4  PROT 7.1  ALBUMIN 3.9    CBC:  Recent Labs  07/28/13 0905 07/29/13 0550  WBC 6.8 6.4  NEUTROABS 4.7 4.0  HGB 14.6 14.1  HCT 40.4 39.8  MCV 89.4 89.0  PLT 214 206   Cardiac Enzymes:  Recent Labs  07/28/13 1958 07/29/13 0137 07/29/13 0910  TROPONINI <0.30 <0.30 <0.30    CBG:  Recent Labs  07/28/13 0838 07/28/13 2317 07/29/13 0706  GLUCAP 140* 72 121*   Hemoglobin A1C:  Recent Labs  07/29/13 0550  HGBA1C 6.2*   Fasting Lipid Panel:  Recent Labs  07/29/13 0550  CHOL 185  HDL 39*  LDLCALC 98  TRIG 914*  CHOLHDL 4.7    Urinalysis    Component Value Date/Time   COLORURINE YELLOW 07/28/2013 1117   APPEARANCEUR CLEAR 07/28/2013 1117   LABSPEC 1.007 07/28/2013 1117   PHURINE 6.5 07/28/2013 1117   GLUCOSEU NEGATIVE 07/28/2013 1117   HGBUR NEGATIVE 07/28/2013 1117   BILIRUBINUR NEGATIVE 07/28/2013 1117   KETONESUR NEGATIVE 07/28/2013 1117   PROTEINUR NEGATIVE 07/28/2013 1117   UROBILINOGEN 0.2 07/28/2013 1117   NITRITE NEGATIVE 07/28/2013 1117   LEUKOCYTESUR NEGATIVE 07/28/2013 1117     Imaging results:  Dg Chest  2 View  07/28/2013   *RADIOLOGY REPORT*  Clinical Data: TIA; history of hypertension  CHEST - 2 VIEW  Comparison: November 09, 2010  Findings: The degree of inspiration is shallow.  No edema or consolidation.  Heart is upper normal in size with normal pulmonary vascularity.  No adenopathy.  No bone lesions.  IMPRESSION: No edema or consolidation.   Original Report Authenticated By: Bretta Bang, M.D.   Mr [facility] Wo Contrast  07/28/2013   *RADIOLOGY REPORT*  Clinical Data:  Dizziness  MRI HEAD WITHOUT AND WITH CONTRAST MRA HEAD WITHOUT CONTRAST MRA NECK WITHOUT AND WITH CONTRAST  Technique:  Multiplanar,  multiecho pulse sequences of the brain and surrounding structures were obtained without and with intravenous contrast.  Angiographic images of the Circle of Willis were obtained using MRA technique without intravenous contrast. Angiographic images of the neck were obtained using MRA technique without and with intravenous contrast.  Carotid stenosis measurements (when applicable) are obtained utilizing NASCET criteria, using the distal internal carotid diameter as the denominator.  Contrast: 20mL MULTIHANCE GADOBENATE DIMEGLUMINE 529 MG/ML IV SOLN  Comparison:   None.  MRI HEAD  Findings:  Diffusion imaging does not show any acute or subacute infarction.  The brainstem and cerebellum are normal.  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  No cortical or large vessel territory infarction.  No mass lesion, hemorrhage, hydrocephalus or extra-axial collection.  No pituitary mass.  No fluid in the sinuses, middle ears or mastoids. No skull or skull base lesion.  IMPRESSION: No acute or significant finding.  Minimal small vessel change of the hemispheric white matter.  MRA HEAD  Findings: Both internal carotid arteries are widely patent into the brain.  The anterior and middle cerebral vessels are patent without proximal stenosis, aneurysm or vascular malformation.  Both vertebral arteries are widely patent to the basilar.  No basilar stenosis.  Posterior circulation branch vessels are normal.  IMPRESSION: Normal intracranial MR angiography of the large and medium-sized vessels.  MRA NECK  Findings: Branching pattern of the brachiocephalic vessels from the arch is normal with the exception that the left vertebral artery arises directly from the aortic arch.  No origin stenoses.  Both common carotid arteries are widely patent to the bifurcation.  No stenosis or irregularity at either carotid bifurcation.  Both cervical internal carotid arteries are normal.  Both vertebral arteries  are widely patent to.  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  IMPRESSION: No stenotic or occlusive disease of the neck vessels.   Original Report Authenticated By: Paulina Fusi, M.D.   Mr Angiogram Neck W Wo Contrast  07/28/2013   *RADIOLOGY REPORT*  Clinical Data:  Dizziness  MRI HEAD WITHOUT AND WITH CONTRAST MRA HEAD WITHOUT CONTRAST MRA NECK WITHOUT AND WITH CONTRAST  Technique:  Multiplanar, multiecho pulse sequences of the brain and surrounding structures were obtained without and with intravenous contrast.  Angiographic images of the Circle of Willis were obtained using MRA technique without intravenous contrast. Angiographic images of the neck were obtained using MRA technique without and with intravenous contrast.  Carotid stenosis measurements (when applicable) are obtained utilizing NASCET criteria, using the distal internal carotid diameter as the denominator.  Contrast: 20mL MULTIHANCE GADOBENATE DIMEGLUMINE 529 MG/ML IV SOLN  Comparison:   None.  MRI HEAD  Findings:  Diffusion imaging does not show any acute or subacute infarction.  The brainstem and cerebellum are normal.  The  cerebral hemispheres show a few old small vessel infarctions in the white matter.  No cortical or large vessel territory infarction.  No mass lesion, hemorrhage, hydrocephalus or extra-axial collection.  No pituitary mass.  No fluid in the sinuses, middle ears or mastoids. No skull or skull base lesion.  IMPRESSION: No acute or significant finding.  Minimal small vessel change of the hemispheric white matter.  MRA HEAD  Findings: Both internal carotid arteries are widely patent into the brain.  The anterior and middle cerebral vessels are patent without proximal stenosis, aneurysm or vascular malformation.  Both vertebral arteries are widely patent to the basilar.  No basilar stenosis.  Posterior circulation branch vessels are normal.  IMPRESSION: Normal intracranial MR angiography of the  large and medium-sized vessels.  MRA NECK  Findings: Branching pattern of the brachiocephalic vessels from the arch is normal with the exception that the left vertebral artery arises directly from the aortic arch.  No origin stenoses.  Both common carotid arteries are widely patent to the bifurcation.  No stenosis or irregularity at either carotid bifurcation.  Both cervical internal carotid arteries are normal.  Both vertebral arteries are widely patent to.  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  IMPRESSION: No stenotic or occlusive disease of the neck vessels.   Original Report Authenticated By: Paulina Fusi, M.D.   Mr [facility] Wo Contrast  07/28/2013   *RADIOLOGY REPORT*  Clinical Data:  Dizziness  MRI HEAD WITHOUT AND WITH CONTRAST MRA HEAD WITHOUT CONTRAST MRA NECK WITHOUT AND WITH CONTRAST  Technique:  Multiplanar, multiecho pulse sequences of the brain and surrounding structures were obtained without and with intravenous contrast.  Angiographic images of the Circle of Willis were obtained using MRA technique without intravenous contrast. Angiographic images of the neck were obtained using MRA technique without and with intravenous contrast.  Carotid stenosis measurements (when applicable) are obtained utilizing NASCET criteria, using the distal internal carotid diameter as the denominator.  Contrast: 20mL MULTIHANCE GADOBENATE DIMEGLUMINE 529 MG/ML IV SOLN  Comparison:   None.  MRI HEAD  Findings:  Diffusion imaging does not show any acute or subacute infarction.  The brainstem and cerebellum are normal.  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  No cortical or large vessel territory infarction.  No mass lesion, hemorrhage, hydrocephalus or extra-axial collection.  No pituitary mass.  No fluid in the sinuses, middle ears or mastoids. No skull or skull base lesion.  IMPRESSION: No acute or significant finding.  Minimal small  vessel change of the hemispheric white matter.  MRA HEAD  Findings: Both internal carotid arteries are widely patent into the brain.  The anterior and middle cerebral vessels are patent without proximal stenosis, aneurysm or vascular malformation.  Both vertebral arteries are widely patent to the basilar.  No basilar stenosis.  Posterior circulation branch vessels are normal.  IMPRESSION: Normal intracranial MR angiography of the large and medium-sized vessels.  MRA NECK  Findings: Branching pattern of the brachiocephalic vessels from the arch is normal with the exception that the left vertebral artery arises directly from the aortic arch.  No origin stenoses.  Both common carotid arteries are widely patent to the bifurcation.  No stenosis or irregularity at either carotid bifurcation.  Both cervical internal carotid arteries are normal.  Both vertebral arteries are widely patent to.  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  IMPRESSION: No stenotic  or occlusive disease of the neck vessels.   Original Report Authenticated By: Paulina Fusi, M.D.    Other results: EKG: Sinus bradycardia, rate 47; incomplete right bundle branch block pattern   Assessment & Plan by Problem:  1.  Episode of dizziness, sensory changes, and generalized weakness.  The initial concern was possible TIA; however given patient's bradycardia and his home medication regimen which includes a calcium blocker and a beta blocker, I think his symptoms may have been due to bradycardia and possible over-control of his blood pressure.  He has had no further symptoms since hospitalization.  His MRI/MRA are unremarkable.  Plan is to stop diltiazem and carefully reinstitute antihypertensive regimen while following his blood pressure and heart rate; monitor; mobilize out of bed; 2-D echocardiogram; continue aspirin.  2.  Diabetes mellitus.  This has been diet controlled.  Plan is follow blood sugars and treat if  indicated.  3.  Other problems and plans as per the resident physician's note.

## 2013-07-29 NOTE — Progress Notes (Signed)
  Echocardiogram 2D Echocardiogram has been performed.  Tiffanyann Deroo, [facility] [place] 07/29/2013, 12:02 PM

## 2013-07-29 NOTE — Progress Notes (Signed)
Subjective:  - Dizziness resolved.  - No chest pain, palpitation. - MRI/MRA negative. - 2D echo pending. - HR ~50 to 65 min  Objective: Vital signs in last 24 hours: Filed Vitals:   07/29/13 0237 07/29/13 0500 07/29/13 1043 07/29/13 1338  BP: 114/70 142/76 147/84 150/85  Pulse: 76 55 61 62  Temp: 98 F (36.7 C) 97.9 F (36.6 C) 98.4 F (36.9 C) 98.5 F (36.9 C)  TempSrc: Oral Oral Oral Oral  Resp: 22 22 20 20   Height:      Weight:      SpO2: 97% 97% 98% 100%   Weight change:   Intake/Output Summary (Last 24 hours) at 07/29/13 1625 Last data filed at 07/29/13 0500  Gross per 24 hour  Intake      0 ml  Output   1100 ml  Net  -1100 ml    Physical Exam:   Filed Vitals:   07/29/13 0237 07/29/13 0500 07/29/13 1043 07/29/13 1338  BP: 114/70 142/76 147/84 150/85  Pulse: 76 55 61 62  Temp: 98 F (36.7 C) 97.9 F (36.6 C) 98.4 F (36.9 C) 98.5 F (36.9 C)  TempSrc: Oral Oral Oral Oral  Resp: 22 22 20 20   Height:      Weight:      SpO2: 97% 97% 98% 100%    General: Not in acute distress HEENT: PERRL, EOMI, no scleral icterus, No JVD or bruit Cardiac: S1/S2, bradycardia. RRR, No murmurs, gallops or rubs Pulm: Good air movement bilaterally. Clear to auscultation bilaterally. No rales, wheezing, rhonchi or rubs. Abd: Soft, nondistended, nontender, no rebound pain, no organomegaly, BS present Ext: No edema. 2+DP/PT pulse bilaterally Musculoskeletal: No joint deformities, erythema, or stiffness, ROM full Skin: No rashes.  Neuro: Alert and oriented X3, cranial nerves II-XII grossly intact, muscle strength 5/5 in all extremeties, sensation to light touch intact. Brachial reflex 2+ bilaterally. Knee reflex 1+ bilaterally.  Psych: Patient is not psychotic, no suicidal or hemocidal ideation.  Lab Results: Basic Metabolic Panel:  Recent Labs Lab 07/28/13 0905 07/29/13 0550  NA 137 138  K 4.3 3.6  CL 102 102  CO2 26 26  GLUCOSE 133* 95  BUN 22 15  CREATININE 0.96  1.01  CALCIUM 9.3 9.0   Liver Function Tests:  Recent Labs Lab 07/28/13 0905  AST 23  ALT 17  ALKPHOS 56  BILITOT 0.4  PROT 7.1  ALBUMIN 3.9   No results found for this basename: LIPASE, AMYLASE,  in the last 168 hours No results found for this basename: AMMONIA,  in the last 168 hours CBC:  Recent Labs Lab 07/28/13 0905 07/29/13 0550  WBC 6.8 6.4  NEUTROABS 4.7 4.0  HGB 14.6 14.1  HCT 40.4 39.8  MCV 89.4 89.0  PLT 214 206   Cardiac Enzymes:  Recent Labs Lab 07/28/13 1958 07/29/13 0137 07/29/13 0910  TROPONINI <0.30 <0.30 <0.30   BNP: No results found for this basename: PROBNP,  in the last 168 hours D-Dimer: No results found for this basename: DDIMER,  in the last 168 hours CBG:  Recent Labs Lab 07/28/13 0838 07/28/13 2317 07/29/13 0706  GLUCAP 140* 72 121*   Hemoglobin A1C:  Recent Labs Lab 07/29/13 0550  HGBA1C 6.2*   Fasting Lipid Panel:  Recent Labs Lab 07/29/13 0550  CHOL 185  HDL 39*  LDLCALC 98  TRIG 161*  CHOLHDL 4.7   Thyroid Function Tests: No results found for this basename: TSH, T4TOTAL, FREET4, T3FREE, THYROIDAB,  in  the last 168 hours Coagulation: No results found for this basename: LABPROT, INR,  in the last 168 hours Anemia Panel: No results found for this basename: VITAMINB12, FOLATE, FERRITIN, TIBC, IRON, RETICCTPCT,  in the last 168 hours Urine Drug Screen: Drugs of Abuse     Component Value Date/Time   LABOPIA NONE DETECTED 07/28/2013 2327   COCAINSCRNUR NONE DETECTED 07/28/2013 2327   LABBENZ NONE DETECTED 07/28/2013 2327   AMPHETMU NONE DETECTED 07/28/2013 2327   THCU NONE DETECTED 07/28/2013 2327   LABBARB NONE DETECTED 07/28/2013 2327    Alcohol Level: No results found for this basename: ETH,  in the last 168 hours Urinalysis:  Recent Labs Lab 07/28/13 1117  COLORURINE YELLOW  LABSPEC 1.007  PHURINE 6.5  GLUCOSEU NEGATIVE  HGBUR NEGATIVE  BILIRUBINUR NEGATIVE  KETONESUR NEGATIVE  PROTEINUR NEGATIVE   UROBILINOGEN 0.2  NITRITE NEGATIVE  LEUKOCYTESUR NEGATIVE   Misc. Labs:  Micro Results: No results found for this or any previous visit (from the past 240 hour(s)). Studies/Results: Dg Chest 2 View  07/28/2013   *RADIOLOGY REPORT*  Clinical Data: TIA; history of hypertension  CHEST - 2 VIEW  Comparison: November 09, 2010  Findings: The degree of inspiration is shallow.  No edema or consolidation.  Heart is upper normal in size with normal pulmonary vascularity.  No adenopathy.  No bone lesions.  IMPRESSION: No edema or consolidation.   Original Report Authenticated By: Bretta Bang, M.D.   Mr [facility] Wo Contrast  07/28/2013   *RADIOLOGY REPORT*  Clinical Data:  Dizziness  MRI HEAD WITHOUT AND WITH CONTRAST MRA HEAD WITHOUT CONTRAST MRA NECK WITHOUT AND WITH CONTRAST  Technique:  Multiplanar, multiecho pulse sequences of the brain and surrounding structures were obtained without and with intravenous contrast.  Angiographic images of the Circle of Willis were obtained using MRA technique without intravenous contrast. Angiographic images of the neck were obtained using MRA technique without and with intravenous contrast.  Carotid stenosis measurements (when applicable) are obtained utilizing NASCET criteria, using the distal internal carotid diameter as the denominator.  Contrast: 20mL MULTIHANCE GADOBENATE DIMEGLUMINE 529 MG/ML IV SOLN  Comparison:   None.  MRI HEAD  Findings:  Diffusion imaging does not show any acute or subacute infarction.  The brainstem and cerebellum are normal.  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  No cortical or large vessel territory infarction.  No mass lesion, hemorrhage, hydrocephalus or extra-axial collection.  No pituitary mass.  No fluid in the sinuses, middle ears or mastoids. No skull or skull base lesion.  IMPRESSION: No acute or significant finding.  Minimal small vessel change of the hemispheric white matter.  MRA  HEAD  Findings: Both internal carotid arteries are widely patent into the brain.  The anterior and middle cerebral vessels are patent without proximal stenosis, aneurysm or vascular malformation.  Both vertebral arteries are widely patent to the basilar.  No basilar stenosis.  Posterior circulation branch vessels are normal.  IMPRESSION: Normal intracranial MR angiography of the large and medium-sized vessels.  MRA NECK  Findings: Branching pattern of the brachiocephalic vessels from the arch is normal with the exception that the left vertebral artery arises directly from the aortic arch.  No origin stenoses.  Both common carotid arteries are widely patent to the bifurcation.  No stenosis or irregularity at either carotid bifurcation.  Both cervical internal carotid arteries are normal.  Both vertebral arteries are widely patent to.  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  IMPRESSION: No stenotic or occlusive disease of the neck vessels.   Original Report Authenticated By: Paulina Fusi, M.D.   Mr Angiogram Neck W Wo Contrast  07/28/2013   *RADIOLOGY REPORT*  Clinical Data:  Dizziness  MRI HEAD WITHOUT AND WITH CONTRAST MRA HEAD WITHOUT CONTRAST MRA NECK WITHOUT AND WITH CONTRAST  Technique:  Multiplanar, multiecho pulse sequences of the brain and surrounding structures were obtained without and with intravenous contrast.  Angiographic images of the Circle of Willis were obtained using MRA technique without intravenous contrast. Angiographic images of the neck were obtained using MRA technique without and with intravenous contrast.  Carotid stenosis measurements (when applicable) are obtained utilizing NASCET criteria, using the distal internal carotid diameter as the denominator.  Contrast: 20mL MULTIHANCE GADOBENATE DIMEGLUMINE 529 MG/ML IV SOLN  Comparison:   None.  MRI HEAD  Findings:  Diffusion imaging does not show any acute or subacute infarction.  The brainstem and  cerebellum are normal.  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  No cortical or large vessel territory infarction.  No mass lesion, hemorrhage, hydrocephalus or extra-axial collection.  No pituitary mass.  No fluid in the sinuses, middle ears or mastoids. No skull or skull base lesion.  IMPRESSION: No acute or significant finding.  Minimal small vessel change of the hemispheric white matter.  MRA HEAD  Findings: Both internal carotid arteries are widely patent into the brain.  The anterior and middle cerebral vessels are patent without proximal stenosis, aneurysm or vascular malformation.  Both vertebral arteries are widely patent to the basilar.  No basilar stenosis.  Posterior circulation branch vessels are normal.  IMPRESSION: Normal intracranial MR angiography of the large and medium-sized vessels.  MRA NECK  Findings: Branching pattern of the brachiocephalic vessels from the arch is normal with the exception that the left vertebral artery arises directly from the aortic arch.  No origin stenoses.  Both common carotid arteries are widely patent to the bifurcation.  No stenosis or irregularity at either carotid bifurcation.  Both cervical internal carotid arteries are normal.  Both vertebral arteries are widely patent to.  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  IMPRESSION: No stenotic or occlusive disease of the neck vessels.   Original Report Authenticated By: Paulina Fusi, M.D.   Mr [facility] Wo Contrast  07/28/2013   *RADIOLOGY REPORT*  Clinical Data:  Dizziness  MRI HEAD WITHOUT AND WITH CONTRAST MRA HEAD WITHOUT CONTRAST MRA NECK WITHOUT AND WITH CONTRAST  Technique:  Multiplanar, multiecho pulse sequences of the brain and surrounding structures were obtained without and with intravenous contrast.  Angiographic images of the Circle of Willis were obtained using MRA technique without intravenous contrast. Angiographic images of  the neck were obtained using MRA technique without and with intravenous contrast.  Carotid stenosis measurements (when applicable) are obtained utilizing NASCET criteria, using the distal internal carotid diameter as the denominator.  Contrast: 20mL MULTIHANCE GADOBENATE DIMEGLUMINE 529 MG/ML IV SOLN  Comparison:   None.  MRI HEAD  Findings:  Diffusion imaging does not show any acute or subacute infarction.  The brainstem and cerebellum are normal.  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  No cortical or large vessel territory infarction.  No mass lesion, hemorrhage, hydrocephalus or extra-axial collection.  No pituitary mass.  No fluid in the sinuses, middle ears  or mastoids. No skull or skull base lesion.  IMPRESSION: No acute or significant finding.  Minimal small vessel change of the hemispheric white matter.  MRA HEAD  Findings: Both internal carotid arteries are widely patent into the brain.  The anterior and middle cerebral vessels are patent without proximal stenosis, aneurysm or vascular malformation.  Both vertebral arteries are widely patent to the basilar.  No basilar stenosis.  Posterior circulation branch vessels are normal.  IMPRESSION: Normal intracranial MR angiography of the large and medium-sized vessels.  MRA NECK  Findings: Branching pattern of the brachiocephalic vessels from the arch is normal with the exception that the left vertebral artery arises directly from the aortic arch.  No origin stenoses.  Both common carotid arteries are widely patent to the bifurcation.  No stenosis or irregularity at either carotid bifurcation.  Both cervical internal carotid arteries are normal.  Both vertebral arteries are widely patent to.  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  IMPRESSION: No stenotic or occlusive disease of the neck vessels.   Original Report Authenticated By: Paulina Fusi, M.D.   Medications:  Scheduled  Meds: . aspirin  325 mg Oral Daily  . enoxaparin (LOVENOX) injection  40 mg Subcutaneous Q24H  . metoprolol tartrate  25 mg Oral BID  . simvastatin  40 mg Oral q1800   Continuous Infusions: . sodium chloride 75 mL/hr at 07/28/13 2115   PRN Meds:.nitroGLYCERIN, senna-docusate Assessment/Plan:  62 year old man with a past medical history of hypertension, hyperlipidemia, myocardial infarction (S./P. stent placement in 2011), diabetes, who presents with a dizziness and tougue numbness. His symptoms completely resolved after several hours.   #: Dizziness: resolved. MRI/MRA negative, ruling out stroke. Though TIA still is on the DD list, it is most likely due to bradycardia. Patient has a calcium blocker and a beta blocker on his home medications. His HR was 40 to 50/min on admission. HR improved today with holding his home medications. Dr. Andrey Campanile spoke with Dr. Patty Sermons, who suggested to start half dose of home metoprolol today, may start lisinopril tomorrow.   - will hole diltiazem, lisinopril, HCTZ today - will start metoprolol 25 mg bid.  - Pending 2D-Echo    - Fall precaution   - continue ASA  #: DM-II: A1c was 6.2 on 8/18. on medicaitons.   -SSI   #: HTN: bp 150/85 mmHg.   -will start metoprol 25 mg bid per Dr. Patty Sermons.     #: CAD: has hx of MI. S/p drug-eluting stent placement to his LAD in 2011. Stable. No chest pain. EKG no new change. Trop negative x 3 - Start metoprolol, but at half of home dose: 25 mg bid   #: DVT PPx: SQ Lovenox    Dispo: Disposition is deferred at this time, awaiting improvement of current medical problems. Anticipated discharge in approximately 2-3 day(s).   The patient does not have a current PCP (No Pcp Per Patient) and does not need an [facility] hospital follow-up appointment after discharge.  The patient does not know have transportation limitations that hinder transportation to clinic appointments.       LOS: 1 day   Lorretta Harp 07/29/2013,  4:25 PM

## 2013-07-30 DIAGNOSIS — R209 Unspecified disturbances of skin sensation: Secondary | ICD-10-CM

## 2013-07-30 LAB — BASIC METABOLIC PANEL
BUN: 14 mg/dL (ref 6–23)
CO2: 24 mEq/L (ref 19–32)
Chloride: 104 mEq/L (ref 96–112)
Creatinine, Ser: 0.91 mg/dL (ref 0.50–1.35)
GFR calc Af Amer: >90 mL/min (ref >90)

## 2013-07-30 LAB — CBC WITH DIFFERENTIAL/PLATELET
Basophils Relative: 0 % (ref 0–1)
HCT: 39.6 % (ref 39.0–52.0)
Hemoglobin: 13.7 g/dL (ref 13.0–17.0)
MCHC: 34.6 g/dL (ref 30.0–36.0)
MCV: 89.4 fL (ref 78.0–100.0)
Monocytes Absolute: 0.7 10*3/uL (ref 0.1–1.0)
Monocytes Relative: 11 % (ref 3–12)
Neutro Abs: 4.3 10*3/uL (ref 1.7–7.7)

## 2013-07-30 MED ORDER — METOPROLOL TARTRATE 25 MG PO TABS: 12.5000 mg | ORAL_TABLET | Freq: Two times a day (BID) | ORAL | Status: DC

## 2013-07-30 MED ORDER — PNEUMOCOCCAL VAC POLYVALENT 25 MCG/0.5ML IJ INJ: 0.5000 mL | INJECTION | INTRAMUSCULAR | Status: DC

## 2013-07-30 NOTE — Progress Notes (Signed)
Subjective: [name] was seen and examined by me this AM.  A spanish language telephone interpreter assisted with communication.  He denies dizziness/lightheadedness, CP/SOB.  He says he is ready to go home.   Objective: Vital signs in last 24 hours: Filed Vitals:   07/29/13 1843 07/29/13 2205 07/30/13 0200 07/30/13 0915  BP: 134/73 127/70 116/72 128/83  Pulse: 69 54 50 57  Temp: 98.4 F (36.9 C) 97.7 F (36.5 C) 98.6 F (37 C) 98.2 F (36.8 C)  TempSrc: Oral Oral Oral Oral  Resp: 20 18 19 18   Height:      Weight:      SpO2: 98% 95% 99% 98%   Weight change:   Intake/Output Summary (Last 24 hours) at 07/30/13 1420 Last data filed at 07/30/13 0754  Gross per 24 hour  Intake    240 ml  Output      0 ml  Net    240 ml   General: resting in bed in NAD HEENT: PERRL, EOMI, no scleral icterus, MMM Cardiac: RRR, no rubs, murmurs or gallops Pulm: clear to auscultation bilaterally, moving normal volumes of air Abd: soft, nontender, nondistended, BS present Ext: warm and well perfused, no pedal edema Neuro: alert and oriented X3, cranial nerves II-XII grossly intact, 5/5 MMS b/l  Lab Results: Basic Metabolic Panel:  Recent Labs Lab 07/29/13 0550 07/30/13 0400  NA 138 138  K 3.6 3.6  CL 102 104  CO2 26 24  GLUCOSE 95 103*  BUN 15 14  CREATININE 1.01 0.91  CALCIUM 9.0 8.8   Liver Function Tests:  Recent Labs Lab 07/28/13 0905  AST 23  ALT 17  ALKPHOS 56  BILITOT 0.4  PROT 7.1  ALBUMIN 3.9   CBC:  Recent Labs Lab 07/29/13 0550 07/30/13 0400  WBC 6.4 6.6  NEUTROABS 4.0 4.3  HGB 14.1 13.7  HCT 39.8 39.6  MCV 89.0 89.4  PLT 206 212   Cardiac Enzymes:  Recent Labs Lab 07/28/13 1958 07/29/13 0137 07/29/13 0910  TROPONINI <0.30 <0.30 <0.30   CBG:  Recent Labs Lab 07/28/13 0838 07/28/13 2317 07/29/13 0706 07/29/13 1627 07/29/13 2150  GLUCAP 140* 72 121* 86 124*   Hemoglobin A1C:  Recent Labs Lab 07/29/13 0550  HGBA1C 6.2*    Fasting Lipid Panel:  Recent Labs Lab 07/29/13 0550  CHOL 185  HDL 39*  LDLCALC 98  TRIG 161*  CHOLHDL 4.7   Urine Drug Screen: Drugs of Abuse     Component Value Date/Time   LABOPIA NONE DETECTED 07/28/2013 2327   COCAINSCRNUR NONE DETECTED 07/28/2013 2327   LABBENZ NONE DETECTED 07/28/2013 2327   AMPHETMU NONE DETECTED 07/28/2013 2327   THCU NONE DETECTED 07/28/2013 2327   LABBARB NONE DETECTED 07/28/2013 2327    Urinalysis:  Recent Labs Lab 07/28/13 1117  COLORURINE YELLOW  LABSPEC 1.007  PHURINE 6.5  GLUCOSEU NEGATIVE  HGBUR NEGATIVE  BILIRUBINUR NEGATIVE  KETONESUR NEGATIVE  PROTEINUR NEGATIVE  UROBILINOGEN 0.2  NITRITE NEGATIVE  LEUKOCYTESUR NEGATIVE   Studies/Results: Dg Chest 2 View  07/28/2013   *RADIOLOGY REPORT*  Clinical Data: TIA; history of hypertension  CHEST - 2 VIEW  Comparison: November 09, 2010  Findings: The degree of inspiration is shallow.  No edema or consolidation.  Heart is upper normal in size with normal pulmonary vascularity.  No adenopathy.  No bone lesions.  IMPRESSION: No edema or consolidation.   Original Report Authenticated By: Bretta Bang, M.D.   [facility] Contrast  07/28/2013   *RADIOLOGY REPORT*  Clinical Data:  Dizziness  MRI HEAD WITHOUT AND WITH CONTRAST MRA HEAD WITHOUT CONTRAST MRA NECK WITHOUT AND WITH CONTRAST  Technique:  Multiplanar, multiecho pulse sequences of the brain and surrounding structures were obtained without and with intravenous contrast.  Angiographic images of the Circle of Willis were obtained using MRA technique without intravenous contrast. Angiographic images of the neck were obtained using MRA technique without and with intravenous contrast.  Carotid stenosis measurements (when applicable) are obtained utilizing NASCET criteria, using the distal internal carotid diameter as the denominator.  Contrast: 20mL MULTIHANCE GADOBENATE DIMEGLUMINE 529 MG/ML IV SOLN  Comparison:   None.  MRI HEAD  Findings:   Diffusion imaging does not show any acute or subacute infarction.  The brainstem and cerebellum are normal.  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  No cortical or large vessel territory infarction.  No mass lesion, hemorrhage, hydrocephalus or extra-axial collection.  No pituitary mass.  No fluid in the sinuses, middle ears or mastoids. No skull or skull base lesion.  IMPRESSION: No acute or significant finding.  Minimal small vessel change of the hemispheric white matter.  MRA HEAD  Findings: Both internal carotid arteries are widely patent into the brain.  The anterior and middle cerebral vessels are patent without proximal stenosis, aneurysm or vascular malformation.  Both vertebral arteries are widely patent to the basilar.  No basilar stenosis.  Posterior circulation branch vessels are normal.  IMPRESSION: Normal intracranial MR angiography of the large and medium-sized vessels.  MRA NECK  Findings: Branching pattern of the brachiocephalic vessels from the arch is normal with the exception that the left vertebral artery arises directly from the aortic arch.  No origin stenoses.  Both common carotid arteries are widely patent to the bifurcation.  No stenosis or irregularity at either carotid bifurcation.  Both cervical internal carotid arteries are normal.  Both vertebral arteries are widely patent to.  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  IMPRESSION: No stenotic or occlusive disease of the neck vessels.   Original Report Authenticated By: Paulina Fusi, M.D.   Mr Angiogram Neck W Wo Contrast  07/28/2013   *RADIOLOGY REPORT*  Clinical Data:  Dizziness  MRI HEAD WITHOUT AND WITH CONTRAST MRA HEAD WITHOUT CONTRAST MRA NECK WITHOUT AND WITH CONTRAST  Technique:  Multiplanar, multiecho pulse sequences of the brain and surrounding structures were obtained without and with intravenous contrast.  Angiographic images of the Circle of  Willis were obtained using MRA technique without intravenous contrast. Angiographic images of the neck were obtained using MRA technique without and with intravenous contrast.  Carotid stenosis measurements (when applicable) are obtained utilizing NASCET criteria, using the distal internal carotid diameter as the denominator.  Contrast: 20mL MULTIHANCE GADOBENATE DIMEGLUMINE 529 MG/ML IV SOLN  Comparison:   None.  MRI HEAD  Findings:  Diffusion imaging does not show any acute or subacute infarction.  The brainstem and cerebellum are normal.  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  No cortical or large vessel territory infarction.  No mass lesion, hemorrhage, hydrocephalus or extra-axial collection.  No pituitary mass.  No fluid in the sinuses, middle ears or mastoids. No skull or skull base lesion.  IMPRESSION: No acute or significant finding.  Minimal small vessel change of the hemispheric white matter.  MRA HEAD  Findings: Both internal carotid arteries are widely patent  into the brain.  The anterior and middle cerebral vessels are patent without proximal stenosis, aneurysm or vascular malformation.  Both vertebral arteries are widely patent to the basilar.  No basilar stenosis.  Posterior circulation branch vessels are normal.  IMPRESSION: Normal intracranial MR angiography of the large and medium-sized vessels.  MRA NECK  Findings: Branching pattern of the brachiocephalic vessels from the arch is normal with the exception that the left vertebral artery arises directly from the aortic arch.  No origin stenoses.  Both common carotid arteries are widely patent to the bifurcation.  No stenosis or irregularity at either carotid bifurcation.  Both cervical internal carotid arteries are normal.  Both vertebral arteries are widely patent to.  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  IMPRESSION: No stenotic or occlusive disease of the neck  vessels.   Original Report Authenticated By: Paulina Fusi, M.D.   Mr Laqueta Jean Wo Contrast  07/28/2013   *RADIOLOGY REPORT*  Clinical Data:  Dizziness  MRI HEAD WITHOUT AND WITH CONTRAST MRA HEAD WITHOUT CONTRAST MRA NECK WITHOUT AND WITH CONTRAST  Technique:  Multiplanar, multiecho pulse sequences of the brain and surrounding structures were obtained without and with intravenous contrast.  Angiographic images of the Circle of Willis were obtained using MRA technique without intravenous contrast. Angiographic images of the neck were obtained using MRA technique without and with intravenous contrast.  Carotid stenosis measurements (when applicable) are obtained utilizing NASCET criteria, using the distal internal carotid diameter as the denominator.  Contrast: 20mL MULTIHANCE GADOBENATE DIMEGLUMINE 529 MG/ML IV SOLN  Comparison:   None.  MRI HEAD  Findings:  Diffusion imaging does not show any acute or subacute infarction.  The brainstem and cerebellum are normal.  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  No cortical or large vessel territory infarction.  No mass lesion, hemorrhage, hydrocephalus or extra-axial collection.  No pituitary mass.  No fluid in the sinuses, middle ears or mastoids. No skull or skull base lesion.  IMPRESSION: No acute or significant finding.  Minimal small vessel change of the hemispheric white matter.  MRA HEAD  Findings: Both internal carotid arteries are widely patent into the brain.  The anterior and middle cerebral vessels are patent without proximal stenosis, aneurysm or vascular malformation.  Both vertebral arteries are widely patent to the basilar.  No basilar stenosis.  Posterior circulation branch vessels are normal.  IMPRESSION: Normal intracranial MR angiography of the large and medium-sized vessels.  MRA NECK  Findings: Branching pattern of the brachiocephalic vessels from the arch is normal with the exception that the left  vertebral artery arises directly from the aortic arch.  No origin stenoses.  Both common carotid arteries are widely patent to the bifurcation.  No stenosis or irregularity at either carotid bifurcation.  Both cervical internal carotid arteries are normal.  Both vertebral arteries are widely patent to.  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  IMPRESSION: No stenotic or occlusive disease of the neck vessels.   Original Report Authenticated By: Paulina Fusi, M.D.   Medications: I have reviewed the patient's current medications. Scheduled Meds: . aspirin  325 mg Oral Daily  . enoxaparin (LOVENOX) injection  40 mg Subcutaneous Q24H  . metoprolol tartrate  25 mg Oral BID  . [START ON 07/31/2013] pneumococcal 23 valent vaccine  0.5 mL Intramuscular Tomorrow-1000  . simvastatin  40 mg Oral q1800   Continuous  Infusions: . sodium chloride 75 mL/hr at 07/28/13 2115   PRN Meds:.nitroGLYCERIN, senna-docusate Assessment/Plan: 63 year old man with a past medical history of hypertension, hyperlipidemia, myocardial infarction (S./P. stent placement in 2011), diabetes, who presents with a dizziness and tougue numbness. His symptoms completely resolved after several hours.   #: Dizziness: resolved. MRI/MRA negative, ruling out stroke. Though TIA still is on the DD list, it is most likely due to bradycardia. Patient has a calcium blocker and a beta blocker on his home medications. His HR was 40 to 50/min on admission. HR improved today with holding his home medications. Dr. Andrey Campanile spoke with Dr. Patty Sermons, who suggested to start half dose of home metoprolol today, may start lisinopril tomorrow.  - will hold diltiazem, lisinopril, HCTZ today  - will start metoprolol 25 mg bid.  - 2D Echo: EF 55-60%; grade 1 diastolic dysfunction  - Walked with PT yesterday and did not experience any dizziness; no future PT/OT needs - continue ASA   #: DM-II: A1c was 6.2 on 8/18. on medicaitons.    -SSI   #: HTN: bp 128/83 mmHg.  - started metoprol 25 mg bid yesterday, per Dr. Patty Sermons; BP 127/70, HR 54 after 25mg  last night; bradycardic to 50 overnight. - want to keep HR above 50 and BPs have been normal so will decrease to 12.5mg  BID at d/c - have held diltiazem, lisinopril, HCTZ due to bradycardia and have advised patient not to resume these medictions  #: CAD: has hx of MI. S/p drug-eluting stent placement to his LAD in 2011. Stable. No chest pain. EKG no new change. Trop negative x 3  -continue metoprolol but lower dose, 12.5mg  BID.  Dispo:  Patient is feeling well, asymptomatic and ready for discharge today.   The patient does not have a current PCP (No Pcp Per Patient) and does need an [facility] hospital follow-up appointment after discharge.  The patient does not know have transportation limitations that hinder transportation to clinic appointments.  .Services Needed at time of discharge: Y = Yes, Blank = No PT:   OT:   RN:   Equipment:   Other:     LOS: 2 days   Evelena Peat, DO 07/30/2013, 2:20 PM

## 2013-07-30 NOTE — Progress Notes (Signed)
Internal Medicine Attending  Date: 07/30/2013  Patient name: Phillip Wells Medical record number: [id number] Date of birth: 11/14/1951 Age: 62 y.o. Gender: male  I saw and evaluated the patient on a.m. rounds with house staff. I reviewed the resident's note by Dr. Andrey Campanile and I agree with the resident's findings and plans as documented in her note.

## 2013-07-30 NOTE — Discharge Summary (Signed)
Patient Name:  Phillip Wells  MRN: [id number]  PCP: No PCP Per Patient  DOB:  Aug 02, 1951       Date of Admission:  07/28/2013  Date of Discharge:  07/30/2013      Attending Physician: Dr. Farley Ly, MD        DISCHARGE DIAGNOSES: 1. Bradycardia 2. HTN (hypertension) 3. Hyperlipemia 4. Diabetes mellitus type 2, noninsulin dependent 5. CAD (coronary artery disease)   DISPOSITION AND FOLLOW-UP: Phillip Wells is to follow-up with the listed providers as detailed below, at patient's visiting, please address following issues:  1. Bradycardia 2. Hypertension and response to single agent therapy  Discharge Orders   Future Appointments Provider Department Dept Phone   08/05/2013 2:45 PM Dow Adolph, MD [facility] [phone]   Future Orders Complete By Expires   Call MD for:  difficulty breathing, headache or visual disturbances  As directed    Call MD for:  extreme fatigue  As directed    Call MD for:  persistant dizziness or light-headedness  As directed    Call MD for:  persistant nausea and vomiting  As directed    Call MD for:  severe uncontrolled pain  As directed    Call MD for:  temperature >100.4  As directed    Diet - low sodium heart healthy  As directed    Increase activity slowly  As directed       DISCHARGE MEDICATIONS:   Medication List    STOP taking these medications       diltiazem 90 MG tablet  Commonly known as:  CARDIZEM     hydrochlorothiazide 25 MG tablet  Commonly known as:  HYDRODIURIL     lisinopril 20 MG tablet  Commonly known as:  PRINIVIL,ZESTRIL      TAKE these medications       aspirin 325 MG tablet  Take 325 mg by mouth daily.     metoprolol tartrate 25 MG tablet  Commonly known as:  LOPRESSOR  Take 0.5 tablets (12.5 mg total) by mouth 2 (two) times daily.     nitroGLYCERIN 0.4 MG SL tablet  Commonly known as:  NITROSTAT  Place 0.4 mg under the tongue every 5 (five)  minutes as needed for chest pain.     pravastatin 80 MG tablet  Commonly known as:  PRAVACHOL  Take 80 mg by mouth daily.        CONSULTS:      PROCEDURES PERFORMED:  Dg Chest 2 View  07/28/2013   *RADIOLOGY REPORT*  Clinical Data: TIA; history of hypertension  CHEST - 2 VIEW  Comparison: November 09, 2010  Findings: The degree of inspiration is shallow.  No edema or consolidation.  Heart is upper normal in size with normal pulmonary vascularity.  No adenopathy.  No bone lesions.  IMPRESSION: No edema or consolidation.   Original Report Authenticated By: Bretta Bang, M.D.   Mr [facility] Wo Contrast  07/28/2013   *RADIOLOGY REPORT*  Clinical Data:  Dizziness  MRI HEAD WITHOUT AND WITH CONTRAST MRA HEAD WITHOUT CONTRAST MRA NECK WITHOUT AND WITH CONTRAST  Technique:  Multiplanar, multiecho pulse sequences of the brain and surrounding structures were obtained without and with intravenous contrast.  Angiographic images of the Circle of Willis were obtained using MRA technique without intravenous contrast. Angiographic images of the neck were obtained using MRA technique without and with intravenous contrast.  Carotid stenosis measurements (when applicable) are obtained utilizing  NASCET criteria, using the distal internal carotid diameter as the denominator.  Contrast: 20mL MULTIHANCE GADOBENATE DIMEGLUMINE 529 MG/ML IV SOLN  Comparison:   None.  MRI HEAD  Findings:  Diffusion imaging does not show any acute or subacute infarction.  The brainstem and cerebellum are normal.  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  No cortical or large vessel territory infarction.  No mass lesion, hemorrhage, hydrocephalus or extra-axial collection.  No pituitary mass.  No fluid in the sinuses, middle ears or mastoids. No skull or skull base lesion.  IMPRESSION: No acute or significant finding.  Minimal small vessel change of the hemispheric white matter.  MRA HEAD   Findings: Both internal carotid arteries are widely patent into the brain.  The anterior and middle cerebral vessels are patent without proximal stenosis, aneurysm or vascular malformation.  Both vertebral arteries are widely patent to the basilar.  No basilar stenosis.  Posterior circulation branch vessels are normal.  IMPRESSION: Normal intracranial MR angiography of the large and medium-sized vessels.  MRA NECK  Findings: Branching pattern of the brachiocephalic vessels from the arch is normal with the exception that the left vertebral artery arises directly from the aortic arch.  No origin stenoses.  Both common carotid arteries are widely patent to the bifurcation.  No stenosis or irregularity at either carotid bifurcation.  Both cervical internal carotid arteries are normal.  Both vertebral arteries are widely patent to.  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  IMPRESSION: No stenotic or occlusive disease of the neck vessels.   Original Report Authenticated By: Paulina Fusi, M.D.   Mr Angiogram Neck W Wo Contrast  07/28/2013   *RADIOLOGY REPORT*  Clinical Data:  Dizziness  MRI HEAD WITHOUT AND WITH CONTRAST MRA HEAD WITHOUT CONTRAST MRA NECK WITHOUT AND WITH CONTRAST  Technique:  Multiplanar, multiecho pulse sequences of the brain and surrounding structures were obtained without and with intravenous contrast.  Angiographic images of the Circle of Willis were obtained using MRA technique without intravenous contrast. Angiographic images of the neck were obtained using MRA technique without and with intravenous contrast.  Carotid stenosis measurements (when applicable) are obtained utilizing NASCET criteria, using the distal internal carotid diameter as the denominator.  Contrast: 20mL MULTIHANCE GADOBENATE DIMEGLUMINE 529 MG/ML IV SOLN  Comparison:   None.  MRI HEAD  Findings:  Diffusion imaging does not show any acute or subacute infarction.  The brainstem and cerebellum  are normal.  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  No cortical or large vessel territory infarction.  No mass lesion, hemorrhage, hydrocephalus or extra-axial collection.  No pituitary mass.  No fluid in the sinuses, middle ears or mastoids. No skull or skull base lesion.  IMPRESSION: No acute or significant finding.  Minimal small vessel change of the hemispheric white matter.  MRA HEAD  Findings: Both internal carotid arteries are widely patent into the brain.  The anterior and middle cerebral vessels are patent without proximal stenosis, aneurysm or vascular malformation.  Both vertebral arteries are widely patent to the basilar.  No basilar stenosis.  Posterior circulation branch vessels are normal.  IMPRESSION: Normal intracranial MR angiography of the large and medium-sized vessels.  MRA NECK  Findings: Branching pattern of the brachiocephalic vessels from the arch is normal with the exception that the left vertebral artery arises directly from the aortic arch.  No origin stenoses.  Both common carotid arteries are widely patent to the bifurcation.  No stenosis or irregularity at either carotid bifurcation.  Both cervical internal carotid arteries are normal.  Both vertebral arteries are widely patent to.  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  IMPRESSION: No stenotic or occlusive disease of the neck vessels.   Original Report Authenticated By: Paulina Fusi, M.D.   Mr [facility] Wo Contrast  07/28/2013   *RADIOLOGY REPORT*  Clinical Data:  Dizziness  MRI HEAD WITHOUT AND WITH CONTRAST MRA HEAD WITHOUT CONTRAST MRA NECK WITHOUT AND WITH CONTRAST  Technique:  Multiplanar, multiecho pulse sequences of the brain and surrounding structures were obtained without and with intravenous contrast.  Angiographic images of the Circle of Willis were obtained using MRA technique without intravenous contrast. Angiographic images of the neck  were obtained using MRA technique without and with intravenous contrast.  Carotid stenosis measurements (when applicable) are obtained utilizing NASCET criteria, using the distal internal carotid diameter as the denominator.  Contrast: 20mL MULTIHANCE GADOBENATE DIMEGLUMINE 529 MG/ML IV SOLN  Comparison:   None.  MRI HEAD  Findings:  Diffusion imaging does not show any acute or subacute infarction.  The brainstem and cerebellum are normal.  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  No cortical or large vessel territory infarction.  No mass lesion, hemorrhage, hydrocephalus or extra-axial collection.  No pituitary mass.  No fluid in the sinuses, middle ears or mastoids. No skull or skull base lesion.  IMPRESSION: No acute or significant finding.  Minimal small vessel change of the hemispheric white matter.  MRA HEAD  Findings: Both internal carotid arteries are widely patent into the brain.  The anterior and middle cerebral vessels are patent without proximal stenosis, aneurysm or vascular malformation.  Both vertebral arteries are widely patent to the basilar.  No basilar stenosis.  Posterior circulation branch vessels are normal.  IMPRESSION: Normal intracranial MR angiography of the large and medium-sized vessels.  MRA NECK  Findings: Branching pattern of the brachiocephalic vessels from the arch is normal with the exception that the left vertebral artery arises directly from the aortic arch.  No origin stenoses.  Both common carotid arteries are widely patent to the bifurcation.  No stenosis or irregularity at either carotid bifurcation.  Both cervical internal carotid arteries are normal.  Both vertebral arteries are widely patent to.  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  IMPRESSION: No stenotic or occlusive disease of the neck vessels.   Original Report Authenticated By: Paulina Fusi, M.D.      ADMISSION DATA: H&P: Mr.  Phillip Wells is a 62 yo male with a PMH of CAD (s/p MI), hypertension, hyperlipidemia, incomplete RBBB and DM type 2. He presents to the ED after experiencing an episode of dizziness and numbness/tingling of his tongue and feet when he awoke this morning. He felt normal when he awoke but around 5AM he began to feel dizzy and had generalized weakness. He stood up to go to the bathroom but felt as if his feet could not support him. He also felt nauseous. He denies the sensations of himself spinning, the room spinning or feeling as if he was going to pass out. He denies CP/palpitations/SOB/vision loss. He did not feel weak on a particular side of his body but says his feet felt numb (R>L) for several days prior to this episode  Physical Exam: Blood pressure 126/63, pulse  48, temperature 98 F (36.7 C), temperature source Oral, resp. rate 14, SpO2 99.00%.  General: resting in bed in NAD; family members at beside  HEENT: PERRL, EOMI, no scleral icterus  Cardiac: regular rhythm, bradycardic, no rubs, murmurs or gallops  Pulm: clear to auscultation bilaterally, moving normal volumes of air  Abd: soft, nontender, nondistended  Ext: warm and well perfused, no pedal edema, +2DPs  Neuro: alert and oriented X3, cranial nerves II-XII grossly intact, 5/5 MMS, babinski negative, rapid hand coordination and finger-to-nose intact  Labs: Basic Metabolic Panel:   Recent Labs   07/28/13 0905   NA  137   K  4.3   CL  102   CO2  26   GLUCOSE  133*   BUN  22   CREATININE  0.96   CALCIUM  9.3    Liver Function Tests:   Recent Labs   07/28/13 0905   AST  23   ALT  17   ALKPHOS  56   BILITOT  0.4   PROT  7.1   ALBUMIN  3.9    CBC:   Recent Labs   07/28/13 0905   WBC  6.8   NEUTROABS  4.7   HGB  14.6   HCT  40.4   MCV  89.4   PLT  214    CBG:   Recent Labs   07/28/13 0838   GLUCAP  140*    Urinalysis:   Recent Labs   07/28/13 1117   COLORURINE  YELLOW   LABSPEC  1.007   PHURINE   6.5   GLUCOSEU  NEGATIVE   HGBUR  NEGATIVE   BILIRUBINUR  NEGATIVE   KETONESUR  NEGATIVE   PROTEINUR  NEGATIVE   UROBILINOGEN  0.2   NITRITE  NEGATIVE   LEUKOCYTESUR  NEGATIVE     HOSPITAL COURSE: #: Dizziness:  The initial concern was for TIA given the patient's symptoms and risk factors.  However, MRI/MRA of the head and neck showed no acute findings and revealed no arterial stenosis or occlusion.  He had no symptoms consistent with vertigo and denied pre-syncope.  The patient was bradycardic (HR 40-50s) and normotensive at admission and his symptoms were likely due to aggressive treatment of his hypertension.  He was compliant with hydrochlorothiazide 25mg , lisinopril 20mg , diltiazem 90mg  BID and metoprolol 50mg  BID at home.  His heart rate improved when these medications were held.  Dr. Patty Sermons, the patient's cardiologist was notified of his admission and recommended we resume his metoprolol at half his home dose and reintroduce the lisinopril and hydrochlorothiazide one at a time as tolerated.  However, with addition of metoprolol 25mg  BID the patient's heart rate dropped as low as 50.  Given his bradycardia and the fact that his blood pressure responded well to metoprolol alone, the decision was made to continue to hold verapamil, hydrochlorothiazide and lisinopril.  [name] remained symptom-free during his hospitalization and was ready for discharge on hospital day 3.  He was instructed (via telephone interpreter) to stop his current blood pressure medications and start taking metoprolol 12.5mg  twice daily.  A hospital follow-up appointment has been scheduled in the Internal Medicine Clinic on 08/05/2013.  #: DM-II:  His HgbA1c was 6.2 on 8/18 and the patient's Diabetes is diet controlled.  His blood glucose was stable in the hospital without the use of insulin.  #: HTN: His blood pressures were up to 150s/80s without any anti-hypertensives and in the 110s/70s with metoprolol  alone.  Metoprolol 12.5mg  BID was started at discharge.  #: CAD: [name] has a history myocardial infarction with a drug-eluting stent placement to his LAD in 2011. He denied chest pain during hospitalization, there were no EKG changes and cardiac enzymes were negative (x3).   DISCHARGE DATA: Vital Signs: BP 128/83  Pulse 57  Temp(Src) 98.2 F (36.8 C) (Oral)  Resp 18  Ht 5\' 7"  (1.702 m)  Wt 86.183 kg (190 lb)  BMI 29.75 kg/m2  SpO2 98%  Labs: Results for orders placed during the hospital encounter of 07/28/13 (from the past 24 hour(s))  GLUCOSE, CAPILLARY     Status: None   Collection Time    07/29/13  4:27 PM      Result Value Range   Glucose-Capillary 86  70 - 99 mg/dL   Comment 1 Documented in Chart    GLUCOSE, CAPILLARY     Status: Abnormal   Collection Time    07/29/13  9:50 PM      Result Value Range   Glucose-Capillary 124 (*) 70 - 99 mg/dL   Comment 1 Notify RN    CBC WITH DIFFERENTIAL     Status: None   Collection Time    07/30/13  4:00 AM      Result Value Range   WBC 6.6  4.0 - 10.5 K/uL   RBC 4.43  4.22 - 5.81 MIL/uL   Hemoglobin 13.7  13.0 - 17.0 g/dL   HCT 16.1  09.6 - 04.5 %   MCV 89.4  78.0 - 100.0 fL   MCH 30.9  26.0 - 34.0 pg   MCHC 34.6  30.0 - 36.0 g/dL   RDW 40.9  81.1 - 91.4 %   Platelets 212  150 - 400 K/uL   Neutrophils Relative % 66  43 - 77 %   Neutro Abs 4.3  1.7 - 7.7 K/uL   Lymphocytes Relative 21  12 - 46 %   Lymphs Abs 1.4  0.7 - 4.0 K/uL   Monocytes Relative 11  3 - 12 %   Monocytes Absolute 0.7  0.1 - 1.0 K/uL   Eosinophils Relative 2  0 - 5 %   Eosinophils Absolute 0.2  0.0 - 0.7 K/uL   Basophils Relative 0  0 - 1 %   Basophils Absolute 0.0  0.0 - 0.1 K/uL  BASIC METABOLIC PANEL     Status: Abnormal   Collection Time    07/30/13  4:00 AM      Result Value Range   Sodium 138  135 - 145 mEq/L   Potassium 3.6  3.5 - 5.1 mEq/L   Chloride 104  96 - 112 mEq/L   CO2 24  19 - 32 mEq/L   Glucose, Bld 103 (*) 70 - 99  mg/dL   BUN 14  6 - 23 mg/dL   Creatinine, Ser 7.82  0.50 - 1.35 mg/dL   Calcium 8.8  8.4 - 95.6 mg/dL   GFR calc non Af Amer 88 (*) >90 mL/min   GFR calc Af Amer >90  >90 mL/min     Services Ordered on Discharge: Y = Yes; Blank = No PT:   OT:   RN:   Equipment:   Other:      Time Spent on Discharge: 35 min   Signed: Evelena Peat PGY 1, Internal Medicine Resident 07/30/2013, 2:16 PM

## 2013-07-30 NOTE — Progress Notes (Signed)
Patient discharged home this afternoon. Instructions given to patient and family with the help of granddaughter who wrote it in spanish for patient to read. Was instructed to follow up with MD next week. Prescription given. Assessments remained unchanged prior to discharge.

## 2013-08-05 ENCOUNTER — Ambulatory Visit (INDEPENDENT_AMBULATORY_CARE_PROVIDER_SITE_OTHER): Payer: No Typology Code available for payment source | Admitting: Internal Medicine

## 2013-08-05 ENCOUNTER — Encounter: Payer: Self-pay | Admitting: Internal Medicine

## 2013-08-05 VITALS — BP 150/80 | HR 68 | Temp 98.2°F | Ht 67.0 in | Wt 196.0 lb

## 2013-08-05 DIAGNOSIS — I10 Essential (primary) hypertension: Secondary | ICD-10-CM

## 2013-08-05 DIAGNOSIS — I498 Other specified cardiac arrhythmias: Secondary | ICD-10-CM

## 2013-08-05 DIAGNOSIS — R001 Bradycardia, unspecified: Secondary | ICD-10-CM

## 2013-08-05 DIAGNOSIS — I4510 Unspecified right bundle-branch block: Secondary | ICD-10-CM

## 2013-08-05 NOTE — Patient Instructions (Addendum)
General Instructions: Please continue taking your medications as before  Take Metoprolol at 12.5 mg twice daily If your blood pressure is more than 180/100, please call clinic. Please follow up with your regular doctors as before  Please do not take Cardizem, Lisinopril or Hydrochlorothiazide   Treatment Goals:  Goals (1 Years of Data) as of 08/05/13         As of Today 07/30/13 07/30/13 07/29/13 07/29/13     Blood Pressure    . Blood Pressure < 150/90  162/83 128/83 116/72 127/70 134/73      Progress Toward Treatment Goals:  Treatment Goal 08/05/2013  Hemoglobin A1C at goal  Blood pressure at goal    Self Care Goals & Plans:  Self Care Goal 08/05/2013  Manage my medications bring my medications to every visit; refill my medications on time       Care Management & Community Referrals:

## 2013-08-06 ENCOUNTER — Encounter: Payer: Self-pay | Admitting: Internal Medicine

## 2013-08-06 NOTE — Assessment & Plan Note (Addendum)
Stable since discharge.  I believe his blood pressure goal should be around 130/70 given his coronary artery disease. Patient does not have a BP cuff machine at home. I encouraged him to buy one from [employer]. I encouraged him to check his blood pressure daily and bring in his record to his next visit. If his blood pressure remains persistently elevated addition of lisinopril can be considered. For now, no changes have been made. He will follow up with PCP in 3-4 weeks with BP records. I encouraged him to not take Lisinopril, Cardizem or HCTZ for now.

## 2013-08-06 NOTE — Progress Notes (Signed)
Patient ID: Phillip Wells, male   DOB: [birth year], 62 y.o.   MRN: [id number]   Subjective:   HPI: Phillip Wells is a 62 y.o. Spanish-speaking gentleman, with past medical history of hypertension, coronary artery disease, requiring a stent in LAD, and incomplete heart block, presents for hospital followup visit.  Patient was discharged on 07/30/2013 for bradycardia, and hypotension, which was found to be related to his antihypertensive medications. Prior to admission, patient was taking Cardizem 90 mg daily, hydrochlorothiazide, 25 mg daily, and lisinopril 20 mg daily in addition to metoprolol, 50 mg daily. He presented with dizziness and bradycardia, with heart rate in the 40s. His medications were adjusted and discharged on metoprolol 12.5 mg daily. Other medications d/ced. Patient reports no symptoms of dizziness since discharge, no palpitations, shortness of breath, chest pain, wheezing, or cough. Reports his been compliant with his medications. He has completely stopped taking his lisinopril, diltiazem, and HCTZ.    Past Medical History  Diagnosis Date  . Coronary artery disease     s/p DES to the LAD  . HTN (hypertension)   . Hyperlipemia   . Myocardial infarction 11/09/10  . Incomplete RBBB    Current Outpatient Prescriptions  Medication Sig Dispense Refill  . aspirin 325 MG tablet Take 325 mg by mouth daily.        . metoprolol tartrate (LOPRESSOR) 25 MG tablet Take 0.5 tablets (12.5 mg total) by mouth 2 (two) times daily.  30 tablet  1  . nitroGLYCERIN (NITROSTAT) 0.4 MG SL tablet Place 0.4 mg under the tongue every 5 (five) minutes as needed for chest pain.       . pravastatin (PRAVACHOL) 80 MG tablet Take 80 mg by mouth daily.       No current facility-administered medications for this visit.   Family History  Problem Relation Age of Onset  . Heart disease Neg Hx    History   Social History  . Marital Status: Married    Spouse Name: N/A   Number of Children: N/A  . Years of Education: N/A   Social History Main Topics  . Smoking status: Former Smoker    Types: Cigarettes    Quit date: 01/31/1981  . Smokeless tobacco: None  . Alcohol Use: No  . Drug Use: No  . Sexual Activity:    Other Topics Concern  . None   Social History Narrative  . None   Review of Systems: Constitutional: Denies fever, chills, diaphoresis, appetite change and fatigue.  Respiratory: Denies SOB, DOE, cough, chest tightness, and wheezing.  Cardiovascular: No chest pain, palpitations and leg swelling.  Gastrointestinal: No abdominal pain, nausea, vomiting, bloody stools Genitourinary: No dysuria, frequency, hematuria, or flank pain.  Musculoskeletal: No myalgias, back pain, joint swelling, arthralgias    Objective:  Physical Exam: Filed Vitals:   08/05/13 1559  BP: 150/80  Pulse: 68  Temp: 98.2 F (36.8 C)  TempSrc: Oral  Height: 5\' 7"  (1.702 m)  Weight: 196 lb (88.905 kg)  SpO2: 98%   General: Well nourished. No acute distress.  Lungs: CTA bilaterally. Heart: RRR; no extra sounds or murmurs  Abdomen: Non-distended, normal BS, soft, nontender; no hepatosplenomegaly  Extremities: No pedal edema. No joint swelling or tenderness. Neurologic: Alert and oriented x3. No obvious neurologic deficits.  Assessment & Plan:  I have discussed my assessment and plan  with Dr. Dalphine Handing as detailed under problem based charting.

## 2013-08-06 NOTE — Progress Notes (Signed)
Case discussed with Dr.Kazibwe at the time of the visit.  We reviewed the resident's history and exam and pertinent patient test results.  I agree with the assessment, diagnosis, and plan of care documented in the resident's note.    

## 2013-08-06 NOTE — Assessment & Plan Note (Signed)
Pulse rate is better at 68. Takes Metoprolol as prescribed at 12.5 mg bid.  No symptoms of dizziness since returning home.  We will continue to monitor.

## 2013-08-30 ENCOUNTER — Other Ambulatory Visit: Payer: Self-pay

## 2013-08-30 MED ORDER — PRAVASTATIN SODIUM 80 MG PO TABS: 80.0000 mg | ORAL_TABLET | Freq: Every day | ORAL | Status: DC

## 2013-09-03 ENCOUNTER — Ambulatory Visit: Payer: Self-pay | Admitting: Internal Medicine

## 2013-09-03 ENCOUNTER — Ambulatory Visit (INDEPENDENT_AMBULATORY_CARE_PROVIDER_SITE_OTHER): Payer: No Typology Code available for payment source | Admitting: Internal Medicine

## 2013-09-03 ENCOUNTER — Encounter: Payer: Self-pay | Admitting: Internal Medicine

## 2013-09-03 VITALS — BP 182/86 | HR 62 | Temp 97.7°F | Ht 68.0 in | Wt 196.4 lb

## 2013-09-03 DIAGNOSIS — E119 Type 2 diabetes mellitus without complications: Secondary | ICD-10-CM

## 2013-09-03 DIAGNOSIS — Z23 Encounter for immunization: Secondary | ICD-10-CM

## 2013-09-03 DIAGNOSIS — Z0000 Encounter for general adult medical examination without abnormal findings: Secondary | ICD-10-CM | POA: Insufficient documentation

## 2013-09-03 DIAGNOSIS — I10 Essential (primary) hypertension: Secondary | ICD-10-CM

## 2013-09-03 DIAGNOSIS — K219 Gastro-esophageal reflux disease without esophagitis: Secondary | ICD-10-CM

## 2013-09-03 DIAGNOSIS — E785 Hyperlipidemia, unspecified: Secondary | ICD-10-CM

## 2013-09-03 MED ORDER — METOPROLOL TARTRATE 25 MG PO TABS: 12.5000 mg | ORAL_TABLET | Freq: Two times a day (BID) | ORAL | Status: DC

## 2013-09-03 MED ORDER — OMEPRAZOLE 40 MG PO CPDR: 40.0000 mg | DELAYED_RELEASE_CAPSULE | Freq: Every day | ORAL | Status: DC

## 2013-09-03 MED ORDER — LISINOPRIL 20 MG PO TABS: 20.0000 mg | ORAL_TABLET | Freq: Every day | ORAL | Status: DC

## 2013-09-03 NOTE — Patient Instructions (Signed)
1.  Continue taking Metoprolol, Aspirin, and Pravastatin 2.  Start taking Lisinopril 20mg  every morning 3.  Start taking Omeprazole 40mg  every morning 30 minutes before you eat.  Reflujo gastroesofgico - Adultos  (Gastroesophageal Reflux Disease, Adult)  El reflujo gastroesofgico ocurre cuando el cido del estmago pasa al esfago. Cuando el cido entra en contacto con el esfago, el cido provoca dolor (inflamacin) en el esfago. Con el tiempo, pueden formarse pequeos agujeros (lceras) en el revestimiento del esfago. CAUSAS   Exceso de Runner, broadcasting/film/video. Esto aplica presin [employer], lo que hace que el cido del estmago suba hacia el esfago.  El hbito de fumar [place] la produccin de cido en el [place].  El consumo de alcohol. Provoca disminucin de la presin en el esfnter esofgico inferior (vlvula o anillo de msculo entre el esfago y Investment banker, corporate), permitiendo que el cido del estmago suba hacia el esfago.  Cenas a ltima hora del da y estmago lleno. Aumenta la presin y la produccin de cido en el estmago.  Malformacin en el esfnter esofgico inferior. A menudo no se halla causa.  SNTOMAS   Ardor y Radiographer, therapeutic parte inferior del pecho detrs del esternn y en la zona media del [place]. Puede ocurrir [employer] por semana o ms a menudo.  Dificultad para tragar.  Dolor de Advertising copywriter.  Tos seca.  Sntomas similares al asma que incluyen sensacin de opresin en el pecho, falta de aire y sibilancias. DIAGNSTICO  El mdico diagnosticar el problema basndose en los sntomas. En algunos casos, se indican radiografas y otras pruebas para verificar si hay complicaciones o para comprobar el estado del [address] y Training and development officer.  TRATAMIENTO  El mdico le indicar medicamentos de venta libre o recetados para ayudar a disminuir la produccin de cido. Consulte con su mdico antes de Corporate investment banker o agregar cualquier medicamento nuevo.  INSTRUCCIONES PARA EL CUIDADO EN  EL HOGAR   Modifique los factores que pueda cambiar. Consulte con su mdico para solicitar orientacin relacionada con la prdida de peso, dejar de fumar y el consumo de alcohol.  Evite las comidas y bebidas que [address] [place], [place]:  [place] con cafena o alcohlicas.  Chocolate.  Sabores a Advertising account planner.  Ajo y cebolla.  Comidas muy condimentadas.  Ctricos como naranjas, limones o limas.  Alimentos que contengan tomate, como salsas, [place] y pizza.  Alimentos fritos y Lexicographer.  Evite acostarse durante 3 horas antes de irse a dormir o antes de tomar una siesta.  Haga comidas pequeas durante Glass blower/designer de 3 comidas abundantes.  Use ropas sueltas. No use nada apretado alrededor de la cintura que cause presin en el estmago.  Levante (eleve) la cabecera de la cama 6 a 8 pulgadas (15 a 20 cm) con bloques de madera. Usar almohadas extra no ayuda.  Solo tome medicamentos que se pueden comprar sin receta o recetados para el dolor, Dentist o fiebre, como le indica el mdico.  No tome aspirina, ibuprofeno ni antiinflamatorios no esteroides. SOLICITE ATENCIN MDICA DE [employer] SI:   [employer], el cuello, la [place], los dientes o la espalda.  El dolor aumenta o cambia la intensidad o la durancin.  Tiene nuseas, vmitos o sudoracin(diaforesis).  Siente falta de aire o dolor en el pecho, o se desmaya.  Vomita y el vmito tiene [place], es de color [place], [place], negro o es similar a la borra del caf o tiene [place].  Las heces son rojas, sanguinolentas o negras.  Estos sntomas pueden ser signos de [address], como enfermedades cardacas, hemorragias gstrias o sangrado esofgico.  ASEGRESE DE QUE:   Comprende estas instrucciones.  Controlar su enfermedad.  Solicitar ayuda de inmediato si no mejora o si empeora. Document Released: 09/07/2005 Document Revised: 02/20/2012 [facility] Patient Information 2014 Phillip Wells, Phillip Wells.  Omeprazole  capsules- Rx Qu es este medicamento? El OMEPRAZOL previene la produccin de cido en el estmago. Este medicamento se [place] para tratar la enfermedad del reflujo gastroesofgico (ERGE), lceras, ciertas bacterias en el estmago, inflamacin del esfago y el sndrome de [place]. Tambin es til en el caso de enfermedades que producen un exceso de cido en el [place]. Este medicamento puede ser utilizado para otros usos; si tiene alguna pregunta consulte con su proveedor de atencin mdica o con su farmacutico. Qu le debo informar a mi profesional de la salud antes de tomar este medicamento? Necesita saber si usted presenta alguno de los siguientes problemas o situaciones: -enfermedad heptica -niveles bajos de magnesio en la sangre -una reaccin alrgica o inusual al omeprazol, a otros medicamentos, alimentos, colorantes o conservantes -si est embarazada o buscando quedar embarazada -si est amamantando a un beb Cmo debo utilizar este medicamento? Tome este medicamento por va oral con un vaso de agua. Siga las instrucciones de la etiqueta del [place]. No triture, rompa ni mastique las cpsulas. Puede abrirlas y espolvorear su contenido en pur de Psychologist, educational o yogur, se puede tomor con jugos de fruta o tambin puede tomarlas rpidamente con agua. Este medicamento acta mejor si se lo toma con el estmago vaco entre 30 y 60 minutos antes del desayuno. Tome sus dosis a intervalos regulares. No tome su medicamento con una frecuencia mayor a la indicada. Hable con su pediatra para informarse acerca del uso de este medicamento en nios. Puede requerir atencin especial. Sobredosis: Pngase en contacto inmediatamente con un centro toxicolgico o una sala de urgencia si usted cree que haya tomado demasiado medicamento. ATENCIN: [employer] es solo para usted. No comparta este medicamento con nadie. Qu sucede si me olvido de una dosis? Si olvida una dosis, tmela lo antes  posible. Si es casi la hora de la prxima dosis, tome slo esa dosis. No tome dosis adicionales o dobles. Qu puede interactuar con este medicamento? No tome esta medicina con ninguno de los siguientes medicamentos: -atazanavir -clopidogrel -nelfinavir Esta medicina tambin puede interactuar con los siguientes medicamentos: -ampicilina -ciertos medicamentos para la ansiedad o para conciliar el sueo  -ciertos medicamentos que tratan o previenen cogulos sanguneos como warfarina -ciclosporina -diazepam -digoxina -disulfiram -diurticos  -sales de hierro -fenitona -medicamentos recetados para infecciones micticas o por levadura tales como itraconazol, quetoconazol, voriconazol -saquinavir -tacrolimo Puede ser que esta lista no menciona todas las posibles interacciones. Informe a su profesional de [employer] de [employer] productos a base de hierbas, medicamentos de [place] o suplementos nutritivos que est tomando. Si usted fuma, consume bebidas alcohlicas o si utiliza drogas ilegales, indqueselo tambin a su profesional de [employer]. Algunas sustancias pueden interactuar con su medicamento. A qu debo estar atento al usar [employer]? Pueden transcurrir [employer] antes de que mejore su dolor de [place]. Consulte a su mdico o a su profesional de la salud si su problema no comienza a mejorar o si empeora. Puede que necesite someterse a anlisis de sangre mientras est tomando [employer]. Qu efectos secundarios puedo tener al [employer] este medicamento? Efectos secundarios que debe informar a su mdico o a Producer, television/film/video de la salud tan  pronto como sea posible: -reacciones alrgicas como erupcin cutnea, picazn o urticarias, hinchazn de la cara, labios o lengua -dolor de hueso, msculo o articulaciones -problemas respiratorios -dolor u opresin en el pecho -orina de color amarillo oscuro o marrn -mareos -pulso cardiaco rpido, irregular -sensacin de Psychiatrist o  aturdimiento -fiebre o dolor de garganta -palpitaciones  -enrojecimiento, formacin de ampollas, descamacin o distensin de la piel, inclusive dentro de la boca -convulsiones -temblores -sangrado o magulladuras inusuales -cansancio o debilidad inusual -color amarillento de los ojos o la piel Efectos secundarios que, por lo general, no requieren atencin mdica (debe informarlos a su mdico o a su profesional de la salud si persisten o si son molestos): -estreimiento -diarrea -boca seca -dolor de cabeza -nauseas Puede ser que esta lista no menciona todos los posibles efectos secundarios. Comunquese a su mdico por asesoramiento mdico [employer]lett[employer]ard. Usted puede informar los efectos secundarios a la FDA por telfono al 1-800-FDA-1088. Dnde debo guardar mi medicina? Mantngala fuera del alcance de los nios. Gurdela a temperatura ambiente de entre 15 y 30 grados C (30 y 39 grados F). Protjala de la luz y la humedad. Deseche todo el medicamento que no haya utilizado, despus de la fecha de vencimiento. ATENCIN: Este folleto es un resumen. Puede ser que no cubra toda la posible informacin. Si usted tiene preguntas acerca de esta medicina, consulte con su mdico, su farmacutico o su profesional de Radiographer, therapeutic.  2012, Elsevier/Gold Standard. (06/07/2010 3:30:21 PM)  Lisinopril tablets Qu es este medicamento? El LISINOPRIL es un inhibidor de la ECA. Este medicamento se utiliza para el tratamiento de la alta presin sangunea o insuficiencia cardiaca. Tambin se [place] para proteger el corazn inmediatamente despus de un ataque cardaco. Este medicamento puede ser utilizado para otros usos; si tiene alguna pregunta consulte con su proveedor de atencin mdica o con su farmacutico. Qu le debo informar a mi profesional de la salud antes de tomar este medicamento? Necesita saber si usted presenta alguno de los [employer] o situaciones: -diabetes -enfermedad  cardiaca o vascular -enfermedad del sistema inmunolgico, como lupus o esclerodermia -enfermedad renal -presin sangunea [place] -hinchazn previa de la lengua, cara o labios con dificultad al respirar o tragar, ronquera o estrechamiento de la garganta -una reaccin alrgica o inusual al lisinopril, a otros inhibidores de la ECA, veneno del insecto, alimentos, colorantes o conservantes -si est embarazada o buscando quedar embarazada -si est amamantando a un beb Cmo debo utilizar este medicamento? Tome este medicamento por va oral con un vaso de agua. Siga las instrucciones de la etiqueta del [place]. Este medicamento se puede tomar con o sin alimentos. Tome sus dosis a intervalos regulares. No deje de tomar este medicamento excepto si as lo indica su mdico o su profesional de [employer]. Hable con su pediatra para informarse acerca del uso de este medicamento en nios. Puede requerir atencin especial. Aunque este medicamento ha sido recetado a nios tan menores como de 6 aos de edad para condiciones selectivas, las precauciones se aplican. Sobredosis: Pngase en contacto inmediatamente con un centro toxicolgico o una sala de urgencia si usted cree que haya tomado demasiado medicamento. ATENCIN: [employer] es solo para usted. No comparta este medicamento con nadie. Qu sucede si me olvido de una dosis? Si olvida una dosis, tmela lo antes posible. Si es casi la hora de la prxima dosis, tome slo esa dosis. No tome dosis adicionales o dobles. Qu puede interactuar con este medicamento? -diurticos -litio -los [place], medicamentos para  el dolor o la inflamacin, como ibuprofeno o naproxeno -suplementos a base de hierba de venta libre, tal como espino blanco -suplementos o sales de potasio -sustitutos de la sal Puede ser que esta lista no menciona todas las posibles interacciones. Informe a su profesional de [employer] de [employer] Inc productos a base de hierbas, medicamentos de [place] o suplementos nutritivos que est tomando. Si usted fuma, consume bebidas alcohlicas o si utiliza drogas ilegales, indqueselo tambin a su profesional de [employer]. Algunas sustancias pueden interactuar con su medicamento. A qu debo estar atento al usar [employer]? Visite a su mdico o a su profesional de la salud para chequear su evolucin peridicamente. Controle su presin sangunea como le haya indicado. Pregunte a su mdico cul debe ser su presin sangunea y cundo deber comunicarse con l o ella. Comunquese con su mdico o con su profesional de la salud si observa que su pulso cardiaco es rpido o irregular. Las mujeres deben informar a su mdico si estn buscando quedar embarazadas o si creen que estn embarazadas. Existe la posibilidad de efectos secundarios graves al beb sin nacer. Para ms informacin hable con su profesional de la salud o su farmacutico. Si sufre ataques severos de diarrea, nuseas, vmito o si suda demasiado, consulte a su mdico o a su profesional de [employer]. La prdida de lquidos corporales puede hacer que sea peligroso tomar [employer]. Puede experimentar mareos o somnolencia. No conduzca ni utilice maquinaria, ni haga nada que Scientist, research (life sciences) en estado de alerta hasta que sepa cmo le afecta este medicamento. No se siente ni se ponga de pie con rapidez, especialmente si es un paciente de edad avanzada. Esto reduce el riesgo de mareos o [employer]. El alcohol puede aumentar los mareos y la somnolencia. Evite consumir bebidas alcohlicas. Evite los sustitutos de la sal a menos que su mdico o su profesional de la salud indique lo contrario. No se trate usted mismo si tiene tos, resfro o dolores mientras est tomando este medicamento sin Science writer a su mdico o a su profesional de [employer]. Algunos ingredientes pueden aumentar su presin sangunea. Qu efectos secundarios puedo tener al [employer] este medicamento? Efectos secundarios que debe  informar a su mdico o a Producer, television/film/video de la salud tan pronto como sea posible: -dolor abdominal con o sin nuseas o vmito -reacciones alrgicas como erupcin cutnea o urticarias, hinchazn de la cara, manos, pies, labios, garganta o lengua -orina de color amarillo oscuro -dificultad al respirar -mareos, aturdimiento o desmayos -fiebre o dolor de garganta -pulso cardiaco irregular, dolor en el pecho -dolor o dificultad para Geographical information systems officer -enrojecimiento, formacin de ampollas, descamacin o distensin de la piel, inclusive dentro de la boca -cansancio inusual -color amarillento de los ojos o la piel Efectos secundarios que, por lo general, no requieren atencin mdica (debe informarlos a su mdico o a su profesional de la salud si persisten o si son molestos): -cambios en el sentido del gusto -tos -disminucin de la funcin o deseo sexual -dolor de Training and development officer -sensibilidad al sol -cansancio Puede ser que esta lista no menciona todos los posibles efectos secundarios. Comunquese a su mdico por asesoramiento mdico [employer]. Usted puede informar los efectos secundarios a la FDA por telfono al 1-800-FDA-1088. Dnde debo guardar mi medicina? Mantngala fuera del alcance de los nios. Gurdela a [employer], entre 15 y 30 grados C (21 y 28 grados F). Protjala de la humedad. Mantenga el envase bien cerrado. Deseche todo el medicamento  que no haya utilizado, despus de la fecha de vencimiento. ATENCIN: Este folleto es un resumen. Puede ser que no cubra toda la posible informacin. Si usted tiene preguntas acerca de esta medicina, consulte con su mdico, su farmacutico o su profesional de Radiographer, therapeutic.  2012, Elsevier/Gold Standard. (03/20/2006 3:31:00 PM)

## 2013-09-03 NOTE — Assessment & Plan Note (Signed)
Patient doing well on Pravastatin 80mg  daily.

## 2013-09-03 NOTE — Assessment & Plan Note (Addendum)
Lab Results  Component Value Date   HGBA1C 6.2* 07/29/2013   HGBA1C 6.3 03/02/2012   HGBA1C 6.6* 06/29/2011     Assessment: Diabetes control: good control (HgbA1C at goal) Progress toward A1C goal:  at goal Comments: Currently not taking any medication.  Plan: Medications:  None Home glucose monitoring: Frequency: no home glucose monitoring Timing:   Instruction/counseling given: reminded to get eye exam, reminded to bring medications to each visit and discussed foot care Educational resources provided:   Self management tools provided:   Other plans: Patient was previously treated with Metformin 500mg  QD, and had been well controlled. It seems Metformin was not on record at last hospitalization.  Patient is asymptomatic at this time.  Will repeat A1C at next visit and patient may need to resume Metformin.   Marland Kitchen

## 2013-09-03 NOTE — Assessment & Plan Note (Signed)
Patient reports some diffuse stomach pains that are worsened by chili and spicy foods, he admits heartburn.  Patient's abdominal exam was benign and genitalia exam negative for inguinal hernia.  Will prescribe Omeprazole 40mg  QD and reassess at next visit.

## 2013-09-03 NOTE — Progress Notes (Signed)
Patient ID: Phillip Wells, male   DOB: 1951/11/22, 62 y.o.   MRN: [id number]   Subjective:   Patient ID: Phillip Wells male   DOB: [birth year] 62 y.o.   MRN: [id number]  HPI: [name] Kid is a 44 y.o. spanish speaking male  With a PMH of DMT2, HTN, HLD, CAD s/p DES to LAD in 2011.  He is accompanied by an interpreter. He was recently hospitalized in august for symptomatic bradycardia and his metoprolol dose was lowered and other BP medications held.  He is here today for f/u of hypertension.  He does not check his BP at home but reports compliance with his current medications.  He denies any further symptoms of dizziness or near syncope.  He does admit an occasional mild HA. He does admit some diffuse abdominal pains that are present for most of the day, he reports this pain is worsened by eating spicy foods like chili.  He does admit to heartburn after eating.  He denies any constipation/ diarrhea/ fever/ or chills.  He denies any testicular fullness.    Past Medical History  Diagnosis Date  . Coronary artery disease     s/p DES to the LAD  . HTN (hypertension)   . Hyperlipemia   . Myocardial infarction 11/09/10  . Incomplete RBBB    Current Outpatient Prescriptions  Medication Sig Dispense Refill  . aspirin 325 MG tablet Take 325 mg by mouth daily.        Marland Kitchen lisinopril (PRINIVIL,ZESTRIL) 20 MG tablet Take 1 tablet (20 mg total) by mouth daily.  30 tablet  5  . metoprolol tartrate (LOPRESSOR) 25 MG tablet Take 0.5 tablets (12.5 mg total) by mouth 2 (two) times daily.  30 tablet  1  . nitroGLYCERIN (NITROSTAT) 0.4 MG SL tablet Place 0.4 mg under the tongue every 5 (five) minutes as needed for chest pain.       Marland Kitchen omeprazole (PRILOSEC) 40 MG capsule Take 1 capsule (40 mg total) by mouth daily.  30 capsule  5  . pravastatin (PRAVACHOL) 80 MG tablet Take 1 tablet (80 mg total) by mouth daily.  30 tablet  2   No current facility-administered medications for  this visit.   Family History  Problem Relation Age of Onset  . Heart disease Neg Hx    History   Social History  . Marital Status: Married    Spouse Name: N/A    Number of Children: N/A  . Years of Education: N/A   Social History Main Topics  . Smoking status: Former Smoker    Types: Cigarettes    Quit date: 01/31/1981  . Smokeless tobacco: None  . Alcohol Use: No  . Drug Use: No  . Sexual Activity: None   Other Topics Concern  . None   Social History Narrative  . None   Review of Systems: Review of Systems  Constitutional: Negative for fever, chills, weight loss and malaise/fatigue.  HENT: Negative for congestion.   Eyes: Negative for blurred vision.  Respiratory: Negative for cough, sputum production and shortness of breath.   Cardiovascular: Negative for chest pain, palpitations and leg swelling.  Gastrointestinal: Positive for heartburn and abdominal pain. Negative for nausea, vomiting, diarrhea, constipation and melena.  Genitourinary: Negative for dysuria.  Skin: Negative for rash.  Neurological: Positive for headaches. Negative for dizziness.  Psychiatric/Behavioral: Negative for depression.    Objective:  Physical Exam: Filed Vitals:   09/03/13 1615  BP: 182/86  Pulse: 62  Temp:  97.7 F (36.5 C)  TempSrc: Oral  Height: 5\' 8"  (1.727 m)  Weight: 196 lb 6.4 oz (89.086 kg)  SpO2: 99%  Physical Exam  Nursing note and vitals reviewed. Constitutional: He is well-developed, well-nourished, and in no distress. No distress.  HENT:  Head: Normocephalic and atraumatic.  Eyes: Conjunctivae are normal.  Cardiovascular: Normal rate, regular rhythm, normal heart sounds and intact distal pulses.   No murmur heard. Pulmonary/Chest: Effort normal and breath sounds normal. No respiratory distress. He has no wheezes. He has no rales.  Abdominal: Soft. Bowel sounds are normal. He exhibits no distension and no mass. There is no tenderness. There is no rebound and no  guarding.  Genitourinary: Testes/scrotum normal. He exhibits no abnormal testicular mass, no testicular tenderness, no abnormal scrotal mass and no scrotal tenderness.  No appreciated fullness or hernia on valsalva.  Musculoskeletal: He exhibits no edema.  Skin: Skin is warm and dry. He is not diaphoretic.  Psychiatric: Affect and judgment normal.     Assessment & Plan:   See Problem Based Assessment and Plan Meds ordered this encounter  Medications  . lisinopril (PRINIVIL,ZESTRIL) 20 MG tablet    Sig: Take 1 tablet (20 mg total) by mouth daily.    Dispense:  30 tablet    Refill:  5  . DISCONTD: metoprolol tartrate (LOPRESSOR) 25 MG tablet    Sig: Take 0.5 tablets (12.5 mg total) by mouth 2 (two) times daily.    Dispense:  30 tablet    Refill:  1  . omeprazole (PRILOSEC) 40 MG capsule    Sig: Take 1 capsule (40 mg total) by mouth daily.    Dispense:  30 capsule    Refill:  5  . metoprolol tartrate (LOPRESSOR) 25 MG tablet    Sig: Take 0.5 tablets (12.5 mg total) by mouth 2 (two) times daily.    Dispense:  30 tablet    Refill:  1

## 2013-09-03 NOTE — Assessment & Plan Note (Signed)
Flu shot given today. Has never had colonoscopy, will need to refer for colonoscopy in near future.

## 2013-09-03 NOTE — Assessment & Plan Note (Signed)
BP Readings from Last 3 Encounters:  09/03/13 182/86  08/05/13 150/80  07/30/13 128/83    Lab Results  Component Value Date   NA 138 07/30/2013   K 3.6 07/30/2013   CREATININE 0.91 07/30/2013    Assessment: Blood pressure control: moderately elevated Progress toward BP goal:  deteriorated Comments:   Plan: Medications:  Continue Metoprolol 12.5 BID, Will add Lisinopril 20mg  QD Educational resources provided: brochure Self management tools provided: home blood pressure logbook Other plans: Patient was previously on Metoprolol 50mg  QD, Lisinopril 20mg  QD, HCTZ 25mg  QD, and Cardizem 90mg  QD.  Had episode of symptomatic bradycardia and medications were stopped, slowly resuming medication.  Patient to follow up in 1 month.

## 2013-09-05 NOTE — Progress Notes (Signed)
I saw and evaluated the patient.  I personally confirmed the key portions of the history and exam documented by Dr. Hoffman and I reviewed pertinent patient test results.  The assessment, diagnosis, and plan were formulated together and I agree with the documentation in the resident's note. 

## 2013-10-03 ENCOUNTER — Encounter: Payer: Self-pay | Admitting: Internal Medicine

## 2013-10-03 ENCOUNTER — Ambulatory Visit (INDEPENDENT_AMBULATORY_CARE_PROVIDER_SITE_OTHER): Payer: No Typology Code available for payment source | Admitting: Internal Medicine

## 2013-10-03 VITALS — BP 156/81 | HR 65 | Temp 97.8°F | Wt 197.2 lb

## 2013-10-03 DIAGNOSIS — R1030 Lower abdominal pain, unspecified: Secondary | ICD-10-CM

## 2013-10-03 DIAGNOSIS — R1031 Right lower quadrant pain: Secondary | ICD-10-CM

## 2013-10-03 DIAGNOSIS — R109 Unspecified abdominal pain: Secondary | ICD-10-CM

## 2013-10-03 DIAGNOSIS — R102 Pelvic and perineal pain: Secondary | ICD-10-CM | POA: Insufficient documentation

## 2013-10-03 DIAGNOSIS — E119 Type 2 diabetes mellitus without complications: Secondary | ICD-10-CM

## 2013-10-03 DIAGNOSIS — I10 Essential (primary) hypertension: Secondary | ICD-10-CM

## 2013-10-03 DIAGNOSIS — N529 Male erectile dysfunction, unspecified: Secondary | ICD-10-CM

## 2013-10-03 LAB — BASIC METABOLIC PANEL
BUN: 18 mg/dL (ref 6–23)
CO2: 26 mEq/L (ref 19–32)
Glucose, Bld: 89 mg/dL (ref 70–99)
Potassium: 4.2 mEq/L (ref 3.5–5.3)
Sodium: 138 mEq/L (ref 135–145)

## 2013-10-03 MED ORDER — TRAZODONE HCL 50 MG PO TABS: 50.0000 mg | ORAL_TABLET | Freq: Every day | ORAL | Status: DC

## 2013-10-03 MED ORDER — LISINOPRIL 40 MG PO TABS: 40.0000 mg | ORAL_TABLET | Freq: Every day | ORAL | Status: DC

## 2013-10-03 NOTE — Progress Notes (Signed)
Interpreter Larrisha Babineau Namihira for Dr Brown 

## 2013-10-03 NOTE — Assessment & Plan Note (Signed)
BP Readings from Last 3 Encounters:  10/03/13 156/81  09/03/13 182/86  08/05/13 150/80    Lab Results  Component Value Date   NA 138 07/30/2013   K 3.6 07/30/2013   CREATININE 0.91 07/30/2013    Assessment: Blood pressure control: mildly elevated Progress toward BP goal:  unchanged Comments: BP elevated today, despite adding lisinopril at his last visit.  Will increase dose of lisinopril, and check BMET.  Plan: Medications:  Increase lisinopril from 20 to 40 mg daily Educational resources provided:   Self management tools provided:   Other plans: check BMET

## 2013-10-03 NOTE — Patient Instructions (Addendum)
General Instructions: Your blood pressure is somewhat high today.  We are increasing your dose of Lisinopril to 40 mg daily.  For your stomach pain, we are checking your urine.  If this is normal, I recommend over-the-counter tylenol, 500 mg, up to 4 times per day if needed.  We are checking some routine blood work today.  We are starting a new medication, Trazodone.  Take 1 tablet every night.  Please return for a follow-up visit in 3 months.   Treatment Goals:  Goals (1 Years of Data) as of 10/03/13         As of Today 09/03/13 08/05/13 07/30/13 07/30/13     Blood Pressure    . Blood Pressure < 130/80  156/81 182/86 150/80 128/83 116/72      Progress Toward Treatment Goals:  Treatment Goal 10/03/2013  Hemoglobin A1C at goal  Blood pressure unchanged    Self Care Goals & Plans:  Self Care Goal 09/03/2013  Manage my medications take my medicines as prescribed; bring my medications to every visit; refill my medications on time  Eat healthy foods drink diet soda or water instead of juice or soda; eat more vegetables; eat foods that are low in salt; eat baked foods instead of fried foods; eat fruit for snacks and desserts    Home Blood Glucose Monitoring 10/03/2013  Check my blood sugar no home glucose monitoring  When to check my blood sugar N/A     Care Management & Community Referrals:  Referral 10/03/2013  Referrals made for care management support none needed

## 2013-10-03 NOTE — Assessment & Plan Note (Signed)
The patient notes ED.  Viagra contraindicated due to nitro. -will try trazodone for ED -if unsuccessful, will try alprostadil suppository

## 2013-10-03 NOTE — Assessment & Plan Note (Signed)
The patient notes a 2-week history of mild, suprapubic pain, without dysuria or other associated symptoms.  Differential includes UTI (unlikely without dysuria) vs BPH (no LUTS).  Exam was negative for inguinal or abdominal wall hernia. -check UA today

## 2013-10-03 NOTE — Progress Notes (Signed)
HPI The patient is a 62 y.o. male with a history of HTN, CAD, presenting for a follow-up visit for HTN, as well as an acute visit for suprapubic pain.  The patient notes a 2-week history of suprapubic pain, which comes and goes, worse in the morning, better throughout the day.  The patient believes omeprazole helps his symptoms somewhat, and he does note some associated heartburn.  He notes no dysuria, urinary frequency, urinary urgency, difficulty starting urinary stream, or dribbling.  He notes no diarrhea/constipation, nausea/vomiting, fevers.   The patient's BP is elevated today.  He notes taking his medications today.  The patient started lisinopril at his last visit.  The patient notes symptoms of erectile dysfunction, slowly progressive over the last several years.  He has nitro tablets for chest pain, though he notes that he rarely uses them.  ROS: General: no fevers, chills, changes in weight, changes in appetite Skin: no rash HEENT: no blurry vision, hearing changes, sore throat Pulm: no dyspnea, coughing, wheezing CV: no chest pain, palpitations, shortness of breath Abd: no nausea/vomiting, diarrhea/constipation GU: no dysuria, hematuria, polyuria Ext: no arthralgias, myalgias Neuro: no weakness, numbness, or tingling  Filed Vitals:   10/03/13 1448  BP: 156/81  Pulse: 65  Temp: 97.8 F (36.6 C)    PEX General: alert, cooperative, and in no apparent distress HEENT: pupils equal round and reactive to light, vision grossly intact, oropharynx clear and non-erythematous  Neck: supple Lungs: clear to ascultation bilaterally, normal work of respiration, no wheezes, rales, ronchi Heart: regular rate and rhythm, no murmurs, gallops, or rubs Abdomen: soft, non-tender, non-distended, normal bowel sounds Pelvic: No inguinal hernia palpated Extremities: no cyanosis, clubbing, or edema Neurologic: alert & oriented X3, cranial nerves II-XII intact, strength grossly intact, sensation  intact to light touch.  MMSE 27/30 (missed month, and 2 letters when spelling "mundo" backwards)  Current Outpatient Prescriptions on File Prior to Visit  Medication Sig Dispense Refill  . aspirin 325 MG tablet Take 325 mg by mouth daily.        Marland Kitchen lisinopril (PRINIVIL,ZESTRIL) 20 MG tablet Take 1 tablet (20 mg total) by mouth daily.  30 tablet  5  . metoprolol tartrate (LOPRESSOR) 25 MG tablet Take 0.5 tablets (12.5 mg total) by mouth 2 (two) times daily.  30 tablet  1  . nitroGLYCERIN (NITROSTAT) 0.4 MG SL tablet Place 0.4 mg under the tongue every 5 (five) minutes as needed for chest pain.       Marland Kitchen omeprazole (PRILOSEC) 40 MG capsule Take 1 capsule (40 mg total) by mouth daily.  30 capsule  5  . pravastatin (PRAVACHOL) 80 MG tablet Take 1 tablet (80 mg total) by mouth daily.  30 tablet  2   No current facility-administered medications on file prior to visit.    Assessment/Plan

## 2013-10-04 ENCOUNTER — Other Ambulatory Visit: Payer: Self-pay | Admitting: Internal Medicine

## 2013-10-04 ENCOUNTER — Telehealth: Payer: Self-pay | Admitting: *Deleted

## 2013-10-04 DIAGNOSIS — I10 Essential (primary) hypertension: Secondary | ICD-10-CM

## 2013-10-04 LAB — URINALYSIS, ROUTINE W REFLEX MICROSCOPIC
Bilirubin Urine: NEGATIVE
Glucose, UA: NEGATIVE mg/dL
Ketones, ur: NEGATIVE mg/dL
Protein, ur: NEGATIVE mg/dL
Specific Gravity, Urine: 1.01 (ref 1.005–1.030)

## 2013-10-04 MED ORDER — LISINOPRIL 20 MG PO TABS: 40.0000 mg | ORAL_TABLET | Freq: Every day | ORAL | Status: DC

## 2013-10-04 NOTE — Progress Notes (Signed)
Change has been made, prescription sent to pharmacy

## 2013-10-04 NOTE — Telephone Encounter (Signed)
Fax from [facility] - requesting Lisinopril 40mg  be changed to 20mg  Take 2 tabs PO daily; states much cheaper for the pt.   Thanks

## 2013-10-07 NOTE — Telephone Encounter (Signed)
Done per Dr Manson Passey.

## 2013-10-07 NOTE — Progress Notes (Signed)
Case discussed with Dr. Brown soon after the resident saw the patient.  We reviewed the resident's history and exam and pertinent patient test results.  I agree with the assessment, diagnosis and plan of care documented in the resident's note. 

## 2013-12-02 ENCOUNTER — Other Ambulatory Visit: Payer: Self-pay | Admitting: Cardiology

## 2013-12-02 ENCOUNTER — Other Ambulatory Visit: Payer: Self-pay | Admitting: Internal Medicine

## 2013-12-06 ENCOUNTER — Other Ambulatory Visit: Payer: Self-pay | Admitting: Cardiology

## 2014-04-25 ENCOUNTER — Other Ambulatory Visit: Payer: Self-pay | Admitting: Internal Medicine

## 2014-04-25 NOTE — Progress Notes (Unsigned)
Patient needs the following health maintenance at next [facility] follow up visit: -Tdap -Colonoscopy -Zostavax -Pneumococcal vaccine  Signed: Courtney ParisEden W Nataniel Gasper, MD 04/25/2014 11:26 AM

## 2014-05-05 ENCOUNTER — Other Ambulatory Visit: Payer: Self-pay | Admitting: Cardiovascular Disease

## 2014-06-09 ENCOUNTER — Other Ambulatory Visit: Payer: Self-pay | Admitting: Cardiology

## 2014-06-10 NOTE — Telephone Encounter (Signed)
Ok to refuse this? Please advise. Thanks, MI

## 2014-06-11 NOTE — Telephone Encounter (Signed)
Per  Dr. Patty SermonsBrackbill patient needs to get from PCP, not seen in office since 2013

## 2014-06-12 ENCOUNTER — Other Ambulatory Visit: Payer: Self-pay | Admitting: Cardiology

## 2014-06-12 ENCOUNTER — Ambulatory Visit: Payer: Self-pay

## 2014-07-07 ENCOUNTER — Ambulatory Visit: Payer: No Typology Code available for payment source | Admitting: Internal Medicine

## 2014-07-08 ENCOUNTER — Ambulatory Visit (INDEPENDENT_AMBULATORY_CARE_PROVIDER_SITE_OTHER): Payer: No Typology Code available for payment source | Admitting: Internal Medicine

## 2014-07-08 ENCOUNTER — Encounter: Payer: Self-pay | Admitting: Internal Medicine

## 2014-07-08 VITALS — BP 148/79 | HR 52 | Temp 97.2°F | Wt 189.6 lb

## 2014-07-08 DIAGNOSIS — Z23 Encounter for immunization: Secondary | ICD-10-CM

## 2014-07-08 DIAGNOSIS — H543 Unqualified visual loss, both eyes: Secondary | ICD-10-CM | POA: Insufficient documentation

## 2014-07-08 DIAGNOSIS — E119 Type 2 diabetes mellitus without complications: Secondary | ICD-10-CM

## 2014-07-08 DIAGNOSIS — I2510 Atherosclerotic heart disease of native coronary artery without angina pectoris: Secondary | ICD-10-CM

## 2014-07-08 DIAGNOSIS — I10 Essential (primary) hypertension: Secondary | ICD-10-CM

## 2014-07-08 DIAGNOSIS — H547 Unspecified visual loss: Secondary | ICD-10-CM

## 2014-07-08 DIAGNOSIS — Z0000 Encounter for general adult medical examination without abnormal findings: Secondary | ICD-10-CM

## 2014-07-08 LAB — COMPLETE METABOLIC PANEL WITH GFR
ALK PHOS: 63 U/L (ref 39–117)
ALT: 19 U/L (ref 0–53)
AST: 25 U/L (ref 0–37)
Albumin: 4.4 g/dL (ref 3.5–5.2)
BILIRUBIN TOTAL: 0.4 mg/dL (ref 0.2–1.2)
BUN: 24 mg/dL — AB (ref 6–23)
CO2: 25 mEq/L (ref 19–32)
CREATININE: 1.08 mg/dL (ref 0.50–1.35)
Calcium: 9.1 mg/dL (ref 8.4–10.5)
Chloride: 105 mEq/L (ref 96–112)
GFR, EST AFRICAN AMERICAN: 83 mL/min
GFR, EST NON AFRICAN AMERICAN: 72 mL/min
GLUCOSE: 93 mg/dL (ref 70–99)
Potassium: 4.3 mEq/L (ref 3.5–5.3)
Sodium: 138 mEq/L (ref 135–145)
Total Protein: 7.7 g/dL (ref 6.0–8.3)

## 2014-07-08 LAB — GLUCOSE, CAPILLARY: Glucose-Capillary: 146 mg/dL — ABNORMAL HIGH (ref 70–99)

## 2014-07-08 LAB — LIPID PANEL
CHOL/HDL RATIO: 6 ratio
CHOLESTEROL: 254 mg/dL — AB (ref 0–200)
HDL: 42 mg/dL (ref >39)
LDL Cholesterol: 138 mg/dL — ABNORMAL HIGH (ref 0–99)
Triglycerides: 369 mg/dL — ABNORMAL HIGH (ref <150)
VLDL: 74 mg/dL — ABNORMAL HIGH (ref 0–40)

## 2014-07-08 LAB — POCT GLYCOSYLATED HEMOGLOBIN (HGB A1C): Hemoglobin A1C: 5.7

## 2014-07-08 MED ORDER — METOPROLOL TARTRATE 25 MG PO TABS: ORAL_TABLET | ORAL | Status: DC

## 2014-07-08 NOTE — Assessment & Plan Note (Signed)
Due for colonoscopy, pneumonia vaccine, tetanus vaccine, and Zostavax.  He would like to split these up into two visits to make it more manageable for him today. -PPSV23 and Tdap today. -Discuss colonoscopy and Zostavax at next visit.

## 2014-07-08 NOTE — Assessment & Plan Note (Signed)
Patient has not been experiencing any shortness of breath or chest pain at rest or with exertion.  He continues to take his aspirin, but he has not been taking pravastatin or lisinopril in several months.  In addition, he has not been taking metoprolol for the last two weeks.  Given his history of CAD and NSTEMI with stenting, he would benefit from lipid lowering therapy, beta blockers, and an ace inhibitor.  However, he had an episode of symptomatic bradycardia in the past, and there is concern that restarting all of these medications at once could be dangerous because he has not been taking them. -Continue aspirin 325 mg daily. -Restart metoprolol 25 mg BID and return to clinic for blood pressure check in 1 week. -Check lipid panel and CMP today given previous ACE inhibitor and statin use. -Consider restarting pravastatin at next visit, and add low dose ACE inhibitor if blood pressure not controlled with metoprolol.

## 2014-07-08 NOTE — Assessment & Plan Note (Addendum)
He reports decreased vision bilaterally and trouble reading with additional blurry vision in his right eye.  The bilateral vision trouble is likely age-related presbyopia, and he would benefit from a eye glass prescription.  The cause of the blurry vision in his right eye is unclear.  He does have what appears to be cholesterol accumulation in front of his medial right iris, but this is not obscuring his vision.  There is not an obvious cataract in his right eye, but this could be a possibility.  He will receive a retinal scan today, but I will refer him for a more extensive eye exam. -Ambulatory referral to opthalmology.

## 2014-07-08 NOTE — Progress Notes (Signed)
   Subjective:    Patient ID: Phillip Wells, male    DOB: Nov 10, 1951, 63 y.o.   MRN: [id number]  HPI Comments: Phillip Wells is a 63 year old man with a history of HTN, DM2, HLD, and CAD s/p DES to LAD in 2011 presenting for management of his chronic medical problems including hypertension.  The history was obtained with the help of a medical interpreter.  He was last seen in 10/14 with suprapubic pain and erectile dysfunction.  Urinalysis was normal and he was started on trazadone for ED.  He is not currently having suprapubic pain, and he thinks it got better with omeprazole.  However, he is not taking it currently.  He says he is not currently having issues with erectile dysfunction, and he is not taking trazodone.  He says he has been doing well overall.  He does not check his blood pressure at home, and he has been out of his blood pressure medications for two weeks.  He occasionally has headaches, but he does not think it has worsened since he stopped his blood pressure medications.  He denies chest pain, palpitations, or shortness of breath.  He reports trouble seeing out of his right eye for the last two months, and he thinks he needs surgery.  His vision has become more cloudy in that eye. He also reports decreased vision and trouble reading out of both eyes.   He took metformin in the past for his diabetes, but he is not currently on any medications.  He does not check his blood sugar at home.  He denies polyphagia, polyuria, nausea, or vomiting.     Review of Systems  Constitutional: Negative for fever, chills, activity change, appetite change and unexpected weight change.  HENT: Negative for congestion and rhinorrhea.   Eyes: Positive for itching and visual disturbance. Negative for pain.  Respiratory: Negative for cough, choking, chest tightness, shortness of breath and wheezing.   Cardiovascular: Negative for chest pain, palpitations and leg swelling.    Gastrointestinal: Negative for nausea, vomiting, diarrhea, constipation and abdominal distention.  Endocrine: Negative for polydipsia, polyphagia and polyuria.  Genitourinary: Negative for dysuria and difficulty urinating.  Neurological: Positive for headaches. Negative for dizziness and light-headedness.  Psychiatric/Behavioral: Negative for dysphoric mood.       Objective:   Physical Exam  Constitutional: He appears well-developed and well-nourished. No distress.  HENT:  Head: Normocephalic and atraumatic.  Eyes: Pupils are equal, round, and reactive to light.  White deposit in medial right iris not obstructing vision. No significant clouding of the iris.  Neck: Normal range of motion. No thyromegaly present.  Cardiovascular: Normal rate, regular rhythm and normal heart sounds.   Pulmonary/Chest: Effort normal and breath sounds normal.  Abdominal: Soft. Bowel sounds are normal. He exhibits no distension. There is no tenderness.  Musculoskeletal: Normal range of motion. He exhibits no edema and no tenderness.  Lymphadenopathy:    He has no cervical adenopathy.  Neurological: He is alert. No cranial nerve deficit. Coordination normal.  Skin: Skin is warm and dry. No rash noted. He is not diaphoretic.  Psychiatric: He has a normal mood and affect.          Assessment & Plan:  Please see problem-based assessment and plan.

## 2014-07-08 NOTE — Assessment & Plan Note (Signed)
BP Readings from Last 3 Encounters:  07/08/14 148/79  10/03/13 156/81  09/03/13 182/86    Lab Results  Component Value Date   NA 138 10/03/2013   K 4.2 10/03/2013   CREATININE 1.17 10/03/2013    Assessment: Blood pressure control: mildly elevated Progress toward BP goal:  deteriorated Comments: It is difficult to determine what medications the patient has been taking, but it appears he has just been on metoprolol of an unclear dose until running out 2-3 weeks ago.  Plan: Medications:  Restart metoprolol 25 mg BID. Other plans: Return to clinic in 1 week for blood pressure recheck and to adjust medications.

## 2014-07-08 NOTE — Patient Instructions (Addendum)
Thank you for coming to clinic today [name].  General instructions: -Your blood pressure is only slightly elevated today even though you haven't been taking your blood pressure medication.  As a result, we are going to start you back on a low dose of metoprolol and have you come back to see how your blood pressure has responded. -Take one tablet of metoprolol 25 mg twice a day. -Let us know if you become light-headed or have chest pain after restarting this medication. -Continue to take one tablet of aspirin 325 mg every day also. -We will look at your labs today and determine if it is worth restarting your other medications. -You received vaccines against tetanus and pneumonia today.  If you develop any reaction to these vaccine such as swelling or trouble breathing, go to the emergency room. -We will make a referral to opthalmology and contact you with an appointment. -Please make a follow up appointment to return to clinic in 1 week so we can adjust your medications.  Please bring your medicines with you each time you come.   Medicines may be  Eye drops  Herbal   Vitamins  Pills  Seeing these help us take care of you.

## 2014-07-08 NOTE — Assessment & Plan Note (Signed)
Lab Results  Component Value Date   HGBA1C 5.7 07/08/2014   HGBA1C 6.2* 07/29/2013   HGBA1C 6.3 03/02/2012     Assessment: Diabetes control: good control (HgbA1C at goal) Progress toward A1C goal:  at goal Comments: Reports eating healthy and avoiding foods with sugar, no symptoms currently.  Plan: Medications:  Diet controlled. Home glucose monitoring: Frequency: no home glucose monitoring Timing:   Instruction/counseling given: reminded to bring medications to each visit and discussed diet Other plans: Foot exam and retinal scan today.

## 2014-07-09 NOTE — Progress Notes (Signed)
I saw patient and discussed his care with resident Dr. Glenard HaringModing at the time of the visit.  We reviewed the resident's history and exam and pertinent patient test results.  I agree with the assessment, diagnosis, and plan of care documented in the resident's note, including plan to resume metoprolol at a lower dose in light of his being out of metoprolol for at least 2 weeks and having been off of lisinopril for months.  It will be important to assess adherence to medications at each visit.

## 2014-07-11 ENCOUNTER — Encounter: Payer: Self-pay | Admitting: Dietician

## 2014-07-15 ENCOUNTER — Other Ambulatory Visit: Payer: Self-pay | Admitting: Internal Medicine

## 2014-07-15 DIAGNOSIS — H547 Unspecified visual loss: Secondary | ICD-10-CM

## 2014-07-23 NOTE — Addendum Note (Signed)
Addended by: Bufford SpikesFULCHER, Nuria Phebus N on: 07/23/2014 10:31 AM   Modules accepted: Orders

## 2014-07-28 ENCOUNTER — Telehealth: Payer: Self-pay | Admitting: Internal Medicine

## 2014-07-28 NOTE — Telephone Encounter (Signed)
Called patient with telephonic interpreter.  Left message about eye scan result of possible glaucoma and told him we would contact him with ophthalmology appointment for further evaluation.  Told [facility] to [phone] with any additional questions.

## 2014-08-07 ENCOUNTER — Other Ambulatory Visit: Payer: Self-pay | Admitting: Cardiology

## 2014-08-07 DIAGNOSIS — I259 Chronic ischemic heart disease, unspecified: Secondary | ICD-10-CM

## 2014-08-07 NOTE — Telephone Encounter (Signed)
Ok to deny? 

## 2014-09-19 LAB — HM DIABETES EYE EXAM

## 2014-09-29 LAB — HM DIABETES EYE EXAM

## 2014-10-01 ENCOUNTER — Encounter: Payer: Self-pay | Admitting: *Deleted

## 2014-10-09 ENCOUNTER — Ambulatory Visit (INDEPENDENT_AMBULATORY_CARE_PROVIDER_SITE_OTHER): Payer: Self-pay | Admitting: Internal Medicine

## 2014-10-09 ENCOUNTER — Encounter: Payer: Self-pay | Admitting: Internal Medicine

## 2014-10-09 VITALS — BP 165/84 | HR 55 | Temp 98.8°F | Wt 190.9 lb

## 2014-10-09 DIAGNOSIS — I10 Essential (primary) hypertension: Secondary | ICD-10-CM

## 2014-10-09 DIAGNOSIS — E785 Hyperlipidemia, unspecified: Secondary | ICD-10-CM

## 2014-10-09 DIAGNOSIS — H543 Unqualified visual loss, both eyes: Secondary | ICD-10-CM

## 2014-10-09 DIAGNOSIS — H547 Unspecified visual loss: Secondary | ICD-10-CM

## 2014-10-09 DIAGNOSIS — R7309 Other abnormal glucose: Secondary | ICD-10-CM

## 2014-10-09 DIAGNOSIS — R7303 Prediabetes: Secondary | ICD-10-CM

## 2014-10-09 DIAGNOSIS — R1030 Lower abdominal pain, unspecified: Secondary | ICD-10-CM

## 2014-10-09 DIAGNOSIS — Z0000 Encounter for general adult medical examination without abnormal findings: Secondary | ICD-10-CM

## 2014-10-09 MED ORDER — LISINOPRIL 10 MG PO TABS: 10.0000 mg | ORAL_TABLET | Freq: Every day | ORAL | Status: DC

## 2014-10-09 NOTE — Patient Instructions (Signed)
Thank you for your visit today.   Please return to the internal medicine clinic in 1 month(s) or sooner if needed for a recheck of your blood pressure.    Please take 25mg  of metoprolol twice daily. I have added lisinopril 10mg  to help your blood pressure.   Please be sure to bring all of your medications with you to every visit; this includes herbal supplements, vitamins, eye drops, and any over-the-counter medications.   Should you have any questions regarding your medications and/or any new or worsening symptoms, please be sure to call the clinic at [phone].   If you believe that you are suffering from a life threatening condition or one that may result in the loss of limb or function, then you should call 911 or proceed to the nearest Emergency Department.     Controle su presin arterial (Managing Your High Blood Pressure) La presin arterial es la medida de la fuerza de la sangre al presionar contra las paredes de las arterias. Las arterias son tubos musculares que estn dentro del sistema circulatorio. La presin arterial no es constante. Se eleva con la actividad, la excitacin o el nerviosismo y disminuye durante el sueo y Facilities managerla relajacin. Si los valores de medicin de la presin arterial se mantienen por arriba de lo normal por Con-waymucho tiempo, hay riesgo de [address]tener problemas de salud. La presin arterial alta (hipertensin) es una enfermedad de larga duracin (crnica) en la que la presin arterial est elevada.  La lectura de la presin arterial se registra con dos nmeros, por ejemplo 120 sobre 80 (o 120/80). El primer nmero, el ms alto, es la presin sistlica. Es la medida de la presin de las arterias cuando el corazn late. El segundo nmero, el ms bajo, es la presin diastlica. Es la medida de la presin en las arterias cuando el corazn se relaja entre latidos.  Es importante Photographermantener la presin arterial en un rango normal para Personal assistantprevenir la salud en general y otros problemas  de salud, como enfermedades del corazn e ictus. Cuando no se controla la presin arterial, el corazn trabaja ms de lo normal. La hipertensin arterial es una enfermedad muy comn en los adultos debido a que tiende a Administrator, Civil Serviceaumentar con la edad. Hombres y mujeres son igualmente propensos a tener hipertensin, pero en diferentes momentos de la vida. Antes de los 45 aos, los hombres son ms propensos a sufrir hipertensin. Despus de 65 aos de edad, las mujeres tienen ms probabilidades de padecerla. La hipertensin es [employer] Compan[employer]uy comn en los afroamericanos. Esta enfermedad generalmente no manifiesta signos ni sntomas. Generalmente se desconoce la causa. El mdico podr indicarle un plan para mantener la presin arterial en un rango normal y saludable.  ETAPAS DE PRESIN ARTERIAL La presin arterial se clasifica en cuatro etapas: normal, prehipertensin, etapa 1 y etapa 2. Se puede leer la presin arterial para determinar qu tipo de tratamiento, si se indicara, es necesario. Las opciones apropiadas para el tratamiento estn vinculadas a estas cuatro etapas:  Normal   Presin sistlica (mm Hg): por debajo de 120.  Presin diastlica (mm Hg): por debajo de 80. Prehipertensin   Presin sistlica (mm Hg): 120 a 139.  Presin diastlica (mm Hg): 80 a 89. Etapa1   Presin sistlica (mm Hg): 140 a 159.  Presin diastlica (mm Hg): 90 a 99. Etapa2   Presin sistlica (mm Hg): 160 o ms.  Presin diastlica (mm Hg): 100 o ms. RIESGOS RELACIONADOS CON LA PRESIN ARTERIAL ALTA Controlar la presin arterial es  una responsabilidad importante. La hipertensin no controlada puede llevar a:   Ataques cardacos.  Ictus.  Debilitamiento de los vasos sanguneos (aneurisma).  Insuficiencia cardaca.  Dao renal.  Dao ocular.  Sndrome metablico.  Problemas de memoria y concentracin. CMO CONTROLAR LA PRESIN ARTERIAL La presin arterial puede ser controlada efectivamente con cambios en el estilo de  vida y de medicamentos (si es necesario). El Firefightermdico le indicar un plan para bajar la presin arterial al rango normal. Su plan debera incluir lo siguiente:  Educacin   Lea toda la informacin proporcionada por sus mdicos acerca de cmo controlar la presin arterial.  Infrmese sobre las ltimas recomendaciones de pautas y [place] Ridgetratamiento. Continuamente se hacen nuevas investigaciones para definir con ms precisin los riesgos y los tratamientos para la hipertensin arterial. Cambiosen el estilo de vida  Control del [place]o.  No fumar.  Mantenerse fsicamente activo.  Disminuir la cantidad de sal de la dieta.  Reducir las situaciones de estrs.  Controlar las enfermedades crnicas, como el colesterol alto o la diabetes.  Reducir el consumo de alcohol. Medicamentos  Estn disponibles diferentes medicamentos (medicamentos antihipertensivos) para que la presin arterial quede dentro de un rango normal. Comunicacin   Revise con su mdico todos los medicamentos que toma ya que puede haber efectos secundarios o interacciones.  Hable con su mdico acerca de la dieta, hbitos de ejercicio y otros factores del estilo de vida que pueden contribuir a la hipertensin arterial.  OceanographerConcurra regularmente a la consulta con el profesional. El mdico puede ayudarle a crear y Dawayne Patriciaajustar su plan para controlar la presin arterial alta. RECOMENDACIONES PARA EL TRATAMIENTO Y CONTROL  Las siguientes recomendaciones se basan en las pautas actuales para controlar la hipertensin arterial en adultos no gestantes. Utilice estas recomendaciones para determinar el perodo de seguimiento adecuado o la opcin de tratamiento basada en la lectura de su presin arterial. Podr conversar sobre estas opciones con su mdico.   Presin sistlica de 120 a 139 o presin diastlica de 80 a 89: Concurra a las visitas de control, segn las indicaciones.  Presin sistlica de 140 a 160 o presin diastlica de 90 a 100: Haga una  visita de control con el profesional dentro de [address].  Presin sistlica por arriba de 160 o presin diastlica por arriba de 100: Haga una visita de control con el profesional dentro de un mes.  Presin sistlica por arriba de 180 o presin diastlica por arriba de 110: Considere la posibilidad de seguir una terapia antihipertensiva; concurra a una visita de control con su mdico dentro de 1 semana.  Presin sistlica por arriba de 200 o presin diastlica por arriba de 120: Comience el tratamiento antihipertensivo; concurra una visita de control con su mdico dentro de 1 semana. Document Released: 08/22/2012 [facility] Patient Information 2015 [place], [place]. This information is not intended to replace advice given to you by your health care provider. Make sure you discuss any questions you have with your health care provider.

## 2014-10-09 NOTE — Assessment & Plan Note (Addendum)
Pt was identified as having DMII but his the highest his HA1c has ever been is 6.2 which would be classified as prediabetes.  I discussed this with him and advised him to continue to watch his diet.  He eats mainly vegetables and little sugary snacks.   -no need to repeat HA1c today  -continue to monitor

## 2014-10-09 NOTE — Assessment & Plan Note (Signed)
-  will address statin use at next OV, not currently on a statin and pt seems resistant to starting any new medications

## 2014-10-09 NOTE — Progress Notes (Signed)
Patient ID: Phillip Wells, male   DOB: 10-17-1951, 63 y.o.   MRN: [id number]    Subjective:   Patient ID: Phillip Wells male    DOB: 10-17-1951 63 y.o.    MRN: [id number] Health Maintenance Due: Health Maintenance Due  Topic Date Due  . Colonoscopy  04/05/1999  . Zostavax  04/04/2009    _________________________________________________  HPI: [name] is a 63 y.o. male here for a routine visit.  Pt has a PMH outlined below.  Please see problem-based charting assessment and plan note for further details of medical issues addressed at today's visit.  PMH: Past Medical History  Diagnosis Date  . Coronary artery disease     s/p DES to the LAD  . HTN (hypertension)   . Hyperlipemia   . Myocardial infarction 11/09/10  . Incomplete RBBB     Medications: Current Outpatient Prescriptions on File Prior to Visit  Medication Sig Dispense Refill  . aspirin 325 MG tablet Take 325 mg by mouth daily.        . metoprolol (LOPRESSOR) 25 MG tablet TAKE ONE TABLET BY MOUTH TWICE DAILY  60 tablet  0  . nitroGLYCERIN (NITROSTAT) 0.4 MG SL tablet Place 0.4 mg under the tongue every 5 (five) minutes as needed for chest pain.        No current facility-administered medications on file prior to visit.    Allergies: No Known Allergies  FH: Family History  Problem Relation Age of Onset  . Heart disease Neg Hx     SH: History   Social History  . Marital Status: Married    Spouse Name: N/A    Number of Children: N/A  . Years of Education: 3   Occupational History  . CONSTRUCTION [employer]   Social History Main Topics  . Smoking status: Former Smoker    Types: Cigarettes    Quit date: 01/31/1981  . Smokeless tobacco: Not on file  . Alcohol Use: No  . Drug Use: No  . Sexual Activity: Not on file   Other Topics Concern  . Not on file   Social History Narrative  . No narrative on file    Review of Systems: Constitutional: Negative  for fever, chills and weight loss.  Eyes: Negative for blurred vision.  Respiratory: Negative for cough and shortness of breath.  Cardiovascular: Negative for chest pain, palpitations and leg swelling.  Gastrointestinal: Negative for nausea, vomiting, abdominal pain, diarrhea, constipation and blood in stool.  Genitourinary: Negative for dysuria, urgency and frequency.  Musculoskeletal: Negative for myalgias and back pain.  Neurological: Negative for dizziness, weakness and headaches.     Objective:   Vital Signs: Filed Vitals:   10/09/14 1612  BP: 165/84  Pulse: 55  Temp: 98.8 F (37.1 C)  TempSrc: Oral  Weight: 190 lb 14.4 oz (86.592 kg)  SpO2: 99%      BP Readings from Last 3 Encounters:  10/09/14 165/84  07/08/14 148/79  10/03/13 156/81    Physical Exam: Constitutional: Vital signs reviewed.  Patient is well-developed and well-nourished in NAD and cooperative with exam.  Head: Normocephalic and atraumatic. Eyes: PERRL, EOMI, conjunctivae nl, no scleral icterus.  Neck: Supple. Cardiovascular: RRR, no MRG. Pulmonary/Chest: normal effort, non-tender to palpation, CTAB, no wheezes, rales, or rhonchi. Abdominal: Soft. NT/ND +BS. Neurological: A&O x3, cranial nerves II-XII are grossly intact, moving all extremities. Extremities: 2+DP b/l; no pitting edema. Skin: Warm, dry and intact. No rash.   Assessment & Plan:  Assessment and plan was discussed and formulated with my attending.

## 2014-10-09 NOTE — Assessment & Plan Note (Signed)
BP still elevated today at 165/84.  Reports compliance with metoprolol 25mg  twice daily.  He is unable to tolerate 50mg  twice daily due to feeling dizzy with that dosage.   -continue metoprolol 25mg  twice daily -d/c metoprolol 50mg  twice daily -add lisinopril 10mg  daily -follow up in 1 month for a BP recheck and BMP

## 2014-10-09 NOTE — Assessment & Plan Note (Addendum)
Reports seeing opthalmology and was given eye drops for "inflammation."  Unfortunately, he did not bring the eye drops with him or any of his other medications.   -will ask [facility] to see if we can obtain records from opthamology (Dr. Burundiman)

## 2014-10-09 NOTE — Assessment & Plan Note (Addendum)
Pt declined influenza vaccine today stating that he received 2 shots at his last OV and got it at last visit in July.  I explained to him that we did not have the influenza vaccine in July.   I tried to explain to him through the interpretor that he received pneumo vaccine and Tdap.  However, he still was reluctant to receive the influenza vaccine stating he would get it next time.

## 2014-10-09 NOTE — Assessment & Plan Note (Addendum)
Pt continues to report suprapubic pain that comes and goes.  He denies any urinary symptoms, fever/chills, N/V.  States that he takes a pill from [place] for infection and the pain goes away.  No suprapubic pain on exam.  Advised him to bring the pills in at the next visit and not to take anymore pills.  -monitor, likely not serious etiology given no progression of symptoms

## 2014-10-15 NOTE — Progress Notes (Signed)
Case discussed with Dr. Gill soon after the resident saw the patient.  We reviewed the resident's history and exam and pertinent patient test results.  I agree with the assessment, diagnosis and plan of care documented in the resident's note. 

## 2014-10-17 ENCOUNTER — Other Ambulatory Visit: Payer: Self-pay | Admitting: Cardiology

## 2014-10-17 ENCOUNTER — Other Ambulatory Visit: Payer: Self-pay | Admitting: *Deleted

## 2014-10-17 ENCOUNTER — Telehealth: Payer: Self-pay

## 2014-10-17 DIAGNOSIS — I10 Essential (primary) hypertension: Secondary | ICD-10-CM

## 2014-10-17 MED ORDER — METOPROLOL TARTRATE 25 MG PO TABS: 25.0000 mg | ORAL_TABLET | Freq: Two times a day (BID) | ORAL | Status: DC

## 2014-10-17 MED ORDER — METOPROLOL TARTRATE 25 MG PO TABS: ORAL_TABLET | ORAL | Status: DC

## 2014-10-17 NOTE — Telephone Encounter (Signed)
Not seen here since 2013.  His PCP has him on 25 mg BID. Would defer to his PCP who has been following him. Ok to refill x1 then he should get further refills from PCP

## 2014-10-17 NOTE — Telephone Encounter (Signed)
[facility] next 2 weeks.

## 2014-10-17 NOTE — Telephone Encounter (Signed)
Dr gill pt is out of med, interp. Came to request refill, i am told by gladysh. Pt does not want to go back to dr Patty Sermonsbrackbill, he filled but only for enough until appt, in your last visit note you increased med to twice daily, please send a new script, thanks!

## 2014-10-20 ENCOUNTER — Other Ambulatory Visit: Payer: Self-pay

## 2014-10-20 DIAGNOSIS — I10 Essential (primary) hypertension: Secondary | ICD-10-CM

## 2014-10-20 MED ORDER — METOPROLOL TARTRATE 25 MG PO TABS: 25.0000 mg | ORAL_TABLET | Freq: Two times a day (BID) | ORAL | Status: DC

## 2014-10-30 ENCOUNTER — Ambulatory Visit (INDEPENDENT_AMBULATORY_CARE_PROVIDER_SITE_OTHER): Payer: Self-pay | Admitting: Internal Medicine

## 2014-10-30 ENCOUNTER — Encounter: Payer: Self-pay | Admitting: Internal Medicine

## 2014-10-30 VITALS — BP 146/79 | HR 57 | Temp 98.8°F | Ht 68.0 in | Wt 192.2 lb

## 2014-10-30 DIAGNOSIS — I10 Essential (primary) hypertension: Secondary | ICD-10-CM

## 2014-10-30 DIAGNOSIS — E785 Hyperlipidemia, unspecified: Secondary | ICD-10-CM

## 2014-10-30 LAB — BASIC METABOLIC PANEL WITH GFR
BUN: 15 mg/dL (ref 6–23)
CHLORIDE: 103 meq/L (ref 96–112)
CO2: 24 meq/L (ref 19–32)
CREATININE: 0.93 mg/dL (ref 0.50–1.35)
Calcium: 8.7 mg/dL (ref 8.4–10.5)
GFR, EST NON AFRICAN AMERICAN: 86 mL/min
GFR, Est African American: >89 mL/min
Glucose, Bld: 113 mg/dL — ABNORMAL HIGH (ref 70–99)
Potassium: 4.1 mEq/L (ref 3.5–5.3)
Sodium: 137 mEq/L (ref 135–145)

## 2014-10-30 MED ORDER — METOPROLOL TARTRATE 25 MG PO TABS: 25.0000 mg | ORAL_TABLET | Freq: Two times a day (BID) | ORAL | Status: DC

## 2014-10-30 MED ORDER — LOVASTATIN 20 MG PO TABS: 20.0000 mg | ORAL_TABLET | Freq: Every day | ORAL | Status: DC

## 2014-10-30 MED ORDER — LISINOPRIL 20 MG PO TABS: 20.0000 mg | ORAL_TABLET | Freq: Every day | ORAL | Status: DC

## 2014-10-30 NOTE — Assessment & Plan Note (Signed)
BP Readings from Last 3 Encounters:  10/30/14 146/79  10/09/14 165/84  07/08/14 148/79    Lab Results  Component Value Date   NA 138 07/08/2014   K 4.3 07/08/2014   CREATININE 1.08 07/08/2014    Assessment: Blood pressure control: controlled Progress toward BP goal:  at goal Comments: Patient taking Lisinopril 10 mg BID and Metoprolol 25 mg BID. BP well controlled, can be further optimized at future visit.   Plan: Medications:  continue current medications; Change to Lisinopril 20 mg DAILY and continue Metoprolol as above Educational resources provided: brochure Self management tools provided: home blood pressure logbook Other plans: BMP today RTC in 3 months

## 2014-10-30 NOTE — Patient Instructions (Signed)
General Instructions:  1. Please schedule a follow up appointment for 3 months.   2. Please take all medications as prescribed.   Take LISINOPRIL 20 mg ONE TIME DAILY.  Take Lovastatin 20 mg one time every night.   Take Metoprolol 25 mg twice daily  Continue Aspirin once daily  3. If you have worsening of your symptoms or new symptoms arise, please call the clinic [phone]), or go to the ER immediately if symptoms are severe.  You have done a great job in taking all your medications. I appreciate it very much. Please continue doing that.    Thank you for bringing your medicines today. This helps us keep you safe from mistakes.   Progress Toward Treatment Goals:  Treatment Goal 07/08/2014  Hemoglobin A1C at goal  Blood pressure deteriorated    Self Care Goals & Plans:  Self Care Goal 10/30/2014  Manage my medications take my medicines as prescribed; bring my medications to every visit; refill my medications on time  Eat healthy foods drink diet soda or water instead of juice or soda; eat more vegetables; eat foods that are low in salt; eat baked foods instead of fried foods; eat fruit for snacks and desserts  Be physically active -  Meeting treatment goals -    Home Blood Glucose Monitoring 07/08/2014  Check my blood sugar no home glucose monitoring  When to check my blood sugar -     Care Management & Community Referrals:  Referral 10/03/2013  Referrals made for care management support none needed

## 2014-10-30 NOTE — Assessment & Plan Note (Signed)
Patient w/ elevated total cholesterol, triglycerides, and LDL on lipid panel from last visit. Based on ASCVD risk %, would like to start patient on Lipitor 40 mg qhs, however, patient has no insurance.  -Will start lovastatin 20 mg qhs for now as this only costs $10 for 90 day supply at [facility]  -Repeat lipid panel in 1 year

## 2014-10-30 NOTE — Progress Notes (Signed)
   Subjective:   Patient ID: Phillip Wells male   DOB: 1951/08/01 63 y.o.   MRN: [id number]  HPI: [name] is a 63 y.o. male w/ PMHx of CAD, HTN, HLD, presents to the clinic today for a follow-up visit. Patient was last seen in clinic on 10/09/14 for suprapubic pain and elevated BP. Today the patient's BP is 146/79 and he states he has been compliant w/ his Metoprolol 25 mg bid and Lisinopril 10 mg qd. However, after further questioning it seems that the patient has been taking Lisinopril 10 mg TWICE daily. He denies any new complaints. He claims his previous suprapubic pain has resolved. No recent SOB, chest pain, nausea, vomiting, abdominal pain, dysuria, LE swelling, dizziness, or lightheadedness. He claims he ahs been trying to eat a healthy diet and exercises regularly.   Past Medical History  Diagnosis Date  . Coronary artery disease     s/p DES to the LAD  . HTN (hypertension)   . Hyperlipemia   . Myocardial infarction 11/09/10  . Incomplete RBBB    Current Outpatient Prescriptions  Medication Sig Dispense Refill  . aspirin 325 MG tablet Take 325 mg by mouth daily.      Marland Kitchen. lisinopril (PRINIVIL,ZESTRIL) 10 MG tablet Take 1 tablet (10 mg total) by mouth daily. 30 tablet 1  . metoprolol tartrate (LOPRESSOR) 25 MG tablet Take 1 tablet (25 mg total) by mouth 2 (two) times daily. TAKE ONE TABLET BY MOUTH TWICE DAILY 180 tablet 0  . nitroGLYCERIN (NITROSTAT) 0.4 MG SL tablet Place 0.4 mg under the tongue every 5 (five) minutes as needed for chest pain.      No current facility-administered medications for this visit.    Review of Systems: General: Denies fever, chills, diaphoresis, appetite change and fatigue.  Respiratory: Denies SOB, DOE, cough, and wheezing.   Cardiovascular: Denies chest pain and palpitations.  Gastrointestinal: Denies nausea, vomiting, abdominal pain, and diarrhea.  Genitourinary: Denies dysuria, increased frequency, and flank  pain. Endocrine: Denies hot or cold intolerance, polyuria, and polydipsia. Musculoskeletal: Denies myalgias, back pain, joint swelling, arthralgias and gait problem.  Skin: Denies pallor, rash and wounds.  Neurological: Denies dizziness, seizures, syncope, weakness, lightheadedness, numbness and headaches.  Psychiatric/Behavioral: Denies mood changes, and sleep disturbances.  Objective:   Physical Exam: Filed Vitals:   10/30/14 1557  BP: 146/79  Pulse: 57  Temp: 98.8 F (37.1 C)  TempSrc: Oral  Height: 5\' 8"  (1.727 m)  Weight: 192 lb 3.2 oz (87.181 kg)  SpO2: 99%   Physical Exam: General: Spanish speaking male, alert, cooperative, NAD. HEENT: PERRL, EOMI. Moist mucus membranes Neck: Full range of motion without pain, supple, no lymphadenopathy or carotid bruits Lungs: Clear to ascultation bilaterally, normal work of respiration, no wheezes, rales, rhonchi Heart: RRR, no murmurs, gallops, or rubs Abdomen: Soft, non-tender, non-distended, BS + Extremities: No cyanosis, clubbing, or edema Neurologic: Alert & oriented X3, cranial nerves II-XII intact, strength grossly intact, sensation intact to light touch   Assessment & Plan:   Please see problem based assessment and plan.

## 2014-10-31 NOTE — Progress Notes (Signed)
Case discussed with Dr. Jones at the time of the visit.  We reviewed the resident's history and exam and pertinent patient test results.  I agree with the assessment, diagnosis and plan of care documented in the resident's note. 

## 2014-11-20 IMAGING — CR DG CHEST 2V
2 series · 2 of 2 positions shown · non-contrast
Comparison: November 09, 2010

CLINICAL DATA: TIA; history of hypertension

CHEST - 2 VIEW

[w chest lat]
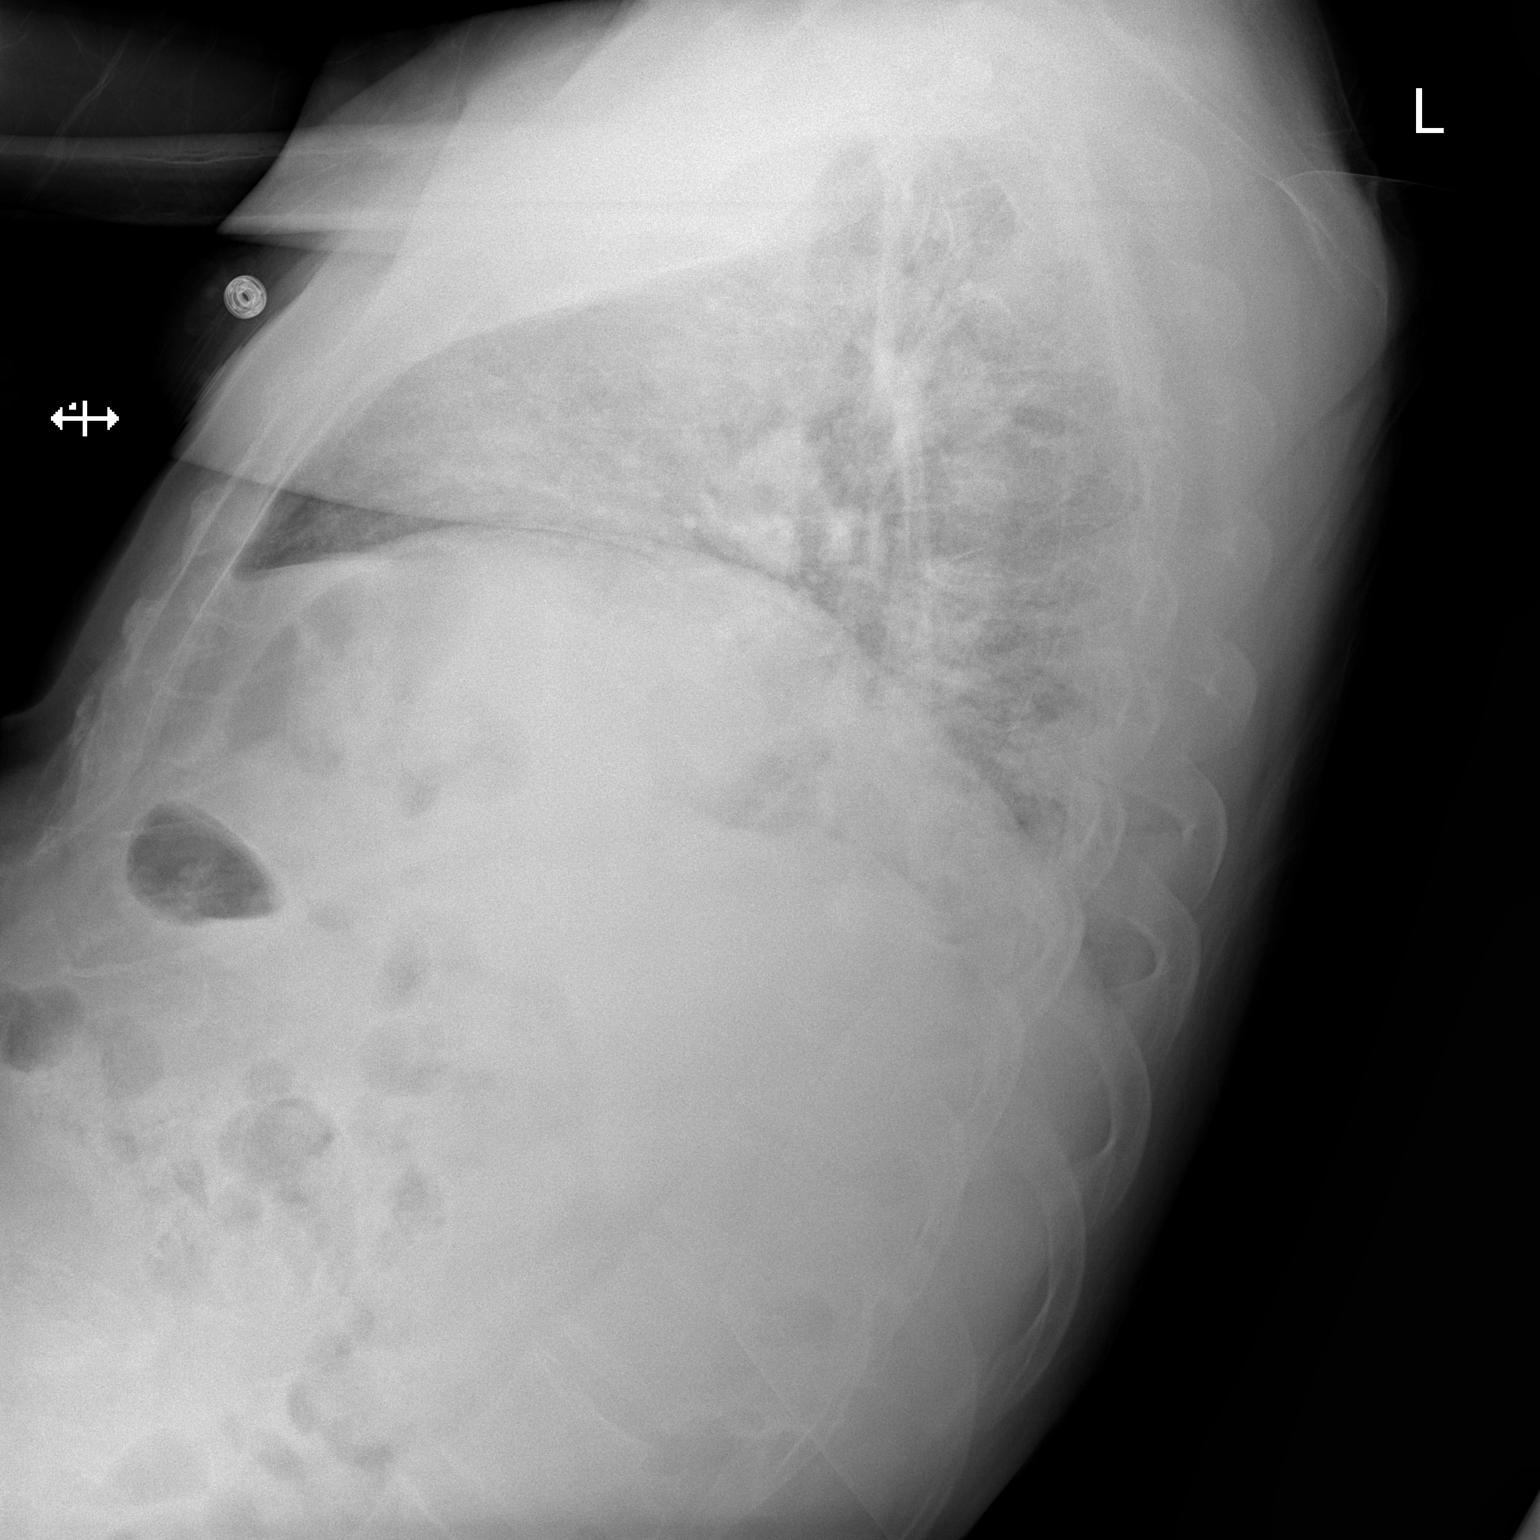

[x chest ap]
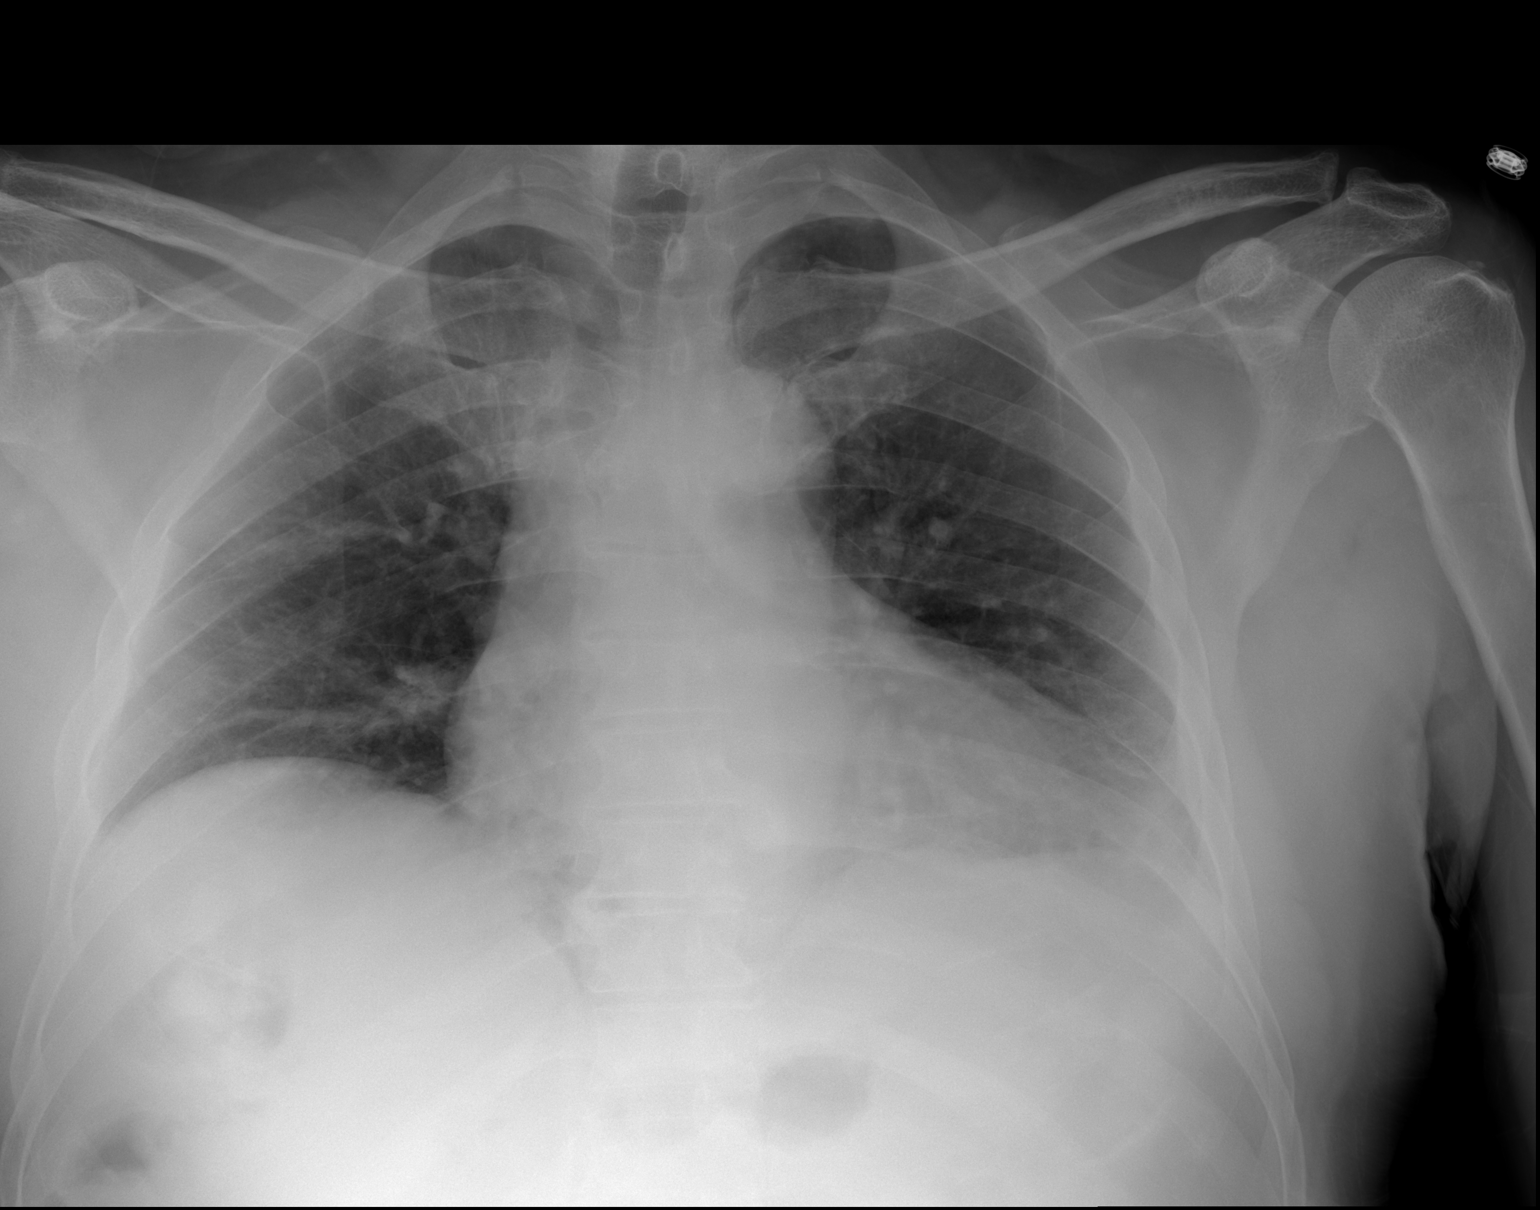

[2 of 2 positions shown; findings below may reference images not displayed]

FINDINGS: The degree of inspiration is shallow.  No edema or
consolidation.  Heart is upper normal in size with normal pulmonary
vascularity.  No adenopathy.  No bone lesions.
IMPRESSION: No edema or consolidation.

## 2014-12-02 ENCOUNTER — Ambulatory Visit (INDEPENDENT_AMBULATORY_CARE_PROVIDER_SITE_OTHER): Payer: Self-pay | Admitting: *Deleted

## 2014-12-02 ENCOUNTER — Ambulatory Visit (INDEPENDENT_AMBULATORY_CARE_PROVIDER_SITE_OTHER): Payer: Self-pay | Admitting: Internal Medicine

## 2014-12-02 ENCOUNTER — Encounter: Payer: Self-pay | Admitting: Internal Medicine

## 2014-12-02 VITALS — BP 168/83 | HR 52 | Temp 98.2°F | Ht 68.0 in | Wt 189.6 lb

## 2014-12-02 DIAGNOSIS — Z0000 Encounter for general adult medical examination without abnormal findings: Secondary | ICD-10-CM

## 2014-12-02 DIAGNOSIS — I10 Essential (primary) hypertension: Secondary | ICD-10-CM

## 2014-12-02 DIAGNOSIS — E785 Hyperlipidemia, unspecified: Secondary | ICD-10-CM

## 2014-12-02 DIAGNOSIS — H109 Unspecified conjunctivitis: Secondary | ICD-10-CM | POA: Insufficient documentation

## 2014-12-02 DIAGNOSIS — Z23 Encounter for immunization: Secondary | ICD-10-CM

## 2014-12-02 MED ORDER — METOPROLOL TARTRATE 25 MG PO TABS: 25.0000 mg | ORAL_TABLET | Freq: Two times a day (BID) | ORAL | Status: DC

## 2014-12-02 MED ORDER — LISINOPRIL 20 MG PO TABS: 20.0000 mg | ORAL_TABLET | Freq: Every day | ORAL | Status: DC

## 2014-12-02 MED ORDER — LOVASTATIN 20 MG PO TABS: 20.0000 mg | ORAL_TABLET | Freq: Every day | ORAL | Status: DC

## 2014-12-02 MED ORDER — POLYVINYL ALCOHOL 1.4 % OP SOLN: 1.0000 [drp] | OPHTHALMIC | Status: DC | PRN

## 2014-12-02 NOTE — Assessment & Plan Note (Signed)
Flu shot given today

## 2014-12-02 NOTE — Patient Instructions (Signed)
General Instructions:  1. Please schedule a follow up appointment for 3 months.   2. Please take all medications as prescribed.   Continue Lisinopril 20 mg daily, Metoprolol 25 mg twice daily, and Lovastatin 20 mg at night. Continue Aspirin daily as well.   3. If you have worsening of your symptoms or new symptoms arise, please call the clinic [phone] or go to the ER immediately if symptoms are severe.   Please bring your medicines with you each time you come to clinic.  Medicines may include prescription medications, over-the-counter medications, herbal remedies, eye drops, vitamins, or other pills.   Progress Toward Treatment Goals:  Treatment Goal 12/02/2014  Hemoglobin A1C -  Blood pressure deteriorated    Self Care Goals & Plans:  Self Care Goal 12/02/2014  Manage my medications take my medicines as prescribed; bring my medications to every visit; refill my medications on time  Eat healthy foods drink diet soda or water instead of juice or soda; eat more vegetables; eat foods that are low in salt; eat baked foods instead of fried foods; eat fruit for snacks and desserts  Be physically active -  Meeting treatment goals maintain the current self-care plan    Home Blood Glucose Monitoring 07/08/2014  Check my blood sugar no home glucose monitoring  When to check my blood sugar -     Care Management & Community Referrals:  Referral 12/02/2014  Referrals made for care management support none needed  Referrals made to community resources none

## 2014-12-02 NOTE — Assessment & Plan Note (Signed)
Patient w/ mild conjunctival irritation on exam. Reviewed most recent ophthalmology note from 09/2014, significant for glaucoma, takes Lumigan eye drops for this. No obvious lesions or discharge. Do not suspect bacterial or even viral conjunctivitis.  -Given Rx for liquifilm tears to use prn for irritation.

## 2014-12-02 NOTE — Assessment & Plan Note (Signed)
[facility] Rx for [facility] to [facility] [address].

## 2014-12-02 NOTE — Progress Notes (Signed)
Subjective:   Patient ID: Phillip Wells male   DOB: [birth year] 63 y.o.   MRN: [id number]  HPI: Phillip Wells is a 63 y.o. male w/ PMHx of CAD, HTN, HLD, presents to the clinic today for a follow-up visit. Patient was last seen in clinic on 10/30/14 for management of his chronic medical conditions. During that visit he was started on a low dose statin medication and his Lisinopril was increased to 20 mg daily for optimal BP control. Today the patient has no significant complaints, accompanied by a family member and Nurse, learning disabilitytranslator. He denies any recent chest pain, SOB, dizziness, lightheadedness, palpitations, fever, or chills. He says his eyes have been somewhat irritated recently and are sometimes red at the end of the day. The patient ahs been seen by an ophthalmologist as recently as 09/29/14 and diagnosed him w/ glaucoma (takes Lumigan eye drops).  Today, the patient has elevated BP (168/83) but it seems that he has had some difficulty recently w/ acquiring his medications. According to his family member, he changed pharmacies and was not able to get his medication until 3-4 days ago. He claims he has been compliant since that time, however, this is still in question as well.   Past Medical History  Diagnosis Date  . Coronary artery disease     s/p DES to the LAD  . HTN (hypertension)   . Hyperlipemia   . Myocardial infarction 11/09/10  . Incomplete RBBB    Current Outpatient Prescriptions  Medication Sig Dispense Refill  . aspirin 325 MG tablet Take 325 mg by mouth daily.      Marland Kitchen. lisinopril (PRINIVIL,ZESTRIL) 20 MG tablet Take 1 tablet (20 mg total) by mouth daily. 30 tablet 1  . lovastatin (MEVACOR) 20 MG tablet Take 1 tablet (20 mg total) by mouth daily. 90 tablet 1  . metoprolol tartrate (LOPRESSOR) 25 MG tablet Take 1 tablet (25 mg total) by mouth 2 (two) times daily. TAKE ONE TABLET BY MOUTH TWICE DAILY 180 tablet 0  . nitroGLYCERIN (NITROSTAT) 0.4 MG SL tablet  Place 0.4 mg under the tongue every 5 (five) minutes as needed for chest pain.      No current facility-administered medications for this visit.    Review of Systems: General: Denies fever, chills, diaphoresis, appetite change and fatigue.  Respiratory: Denies SOB, DOE, cough, and wheezing.   Cardiovascular: Denies chest pain and palpitations.  Gastrointestinal: Denies nausea, vomiting, abdominal pain, and diarrhea.  Genitourinary: Denies dysuria, increased frequency, and flank pain. Endocrine: Denies hot or cold intolerance, polyuria, and polydipsia. Musculoskeletal: Denies myalgias, back pain, joint swelling, arthralgias and gait problem.  Skin: Denies pallor, rash and wounds.  Neurological: Denies dizziness, seizures, syncope, weakness, lightheadedness, numbness and headaches.  Psychiatric/Behavioral: Denies mood changes, and sleep disturbances.   Objective:   Physical Exam: Filed Vitals:   12/02/14 1553  BP: 168/83  Pulse: 52  Temp: 98.2 F (36.8 C)  TempSrc: Oral  Weight: 189 lb 9.6 oz (86.002 kg)  SpO2: 100%    General: Spanish speaking male, alert, cooperative, NAD. HEENT: PERRL, EOMI. Moist mucus membranes. Mild conjunctival injection.  Neck: Full range of motion without pain, supple, no lymphadenopathy or carotid bruits Lungs: Clear to ascultation bilaterally, normal work of respiration, no wheezes, rales, rhonchi Heart: RRR, no murmurs, gallops, or rubs Abdomen: Soft, non-tender, non-distended, BS + Extremities: No cyanosis, clubbing, or edema Neurologic: Alert & oriented X3, cranial nerves II-XII intact, strength grossly intact, sensation intact to light touch  Assessment & Plan:   Please see problem based assessment and plan.

## 2014-12-02 NOTE — Assessment & Plan Note (Signed)
BP Readings from Last 3 Encounters:  12/02/14 168/83  10/30/14 146/79  10/09/14 165/84    Lab Results  Component Value Date   NA 137 10/30/2014   K 4.1 10/30/2014   CREATININE 0.93 10/30/2014    Assessment: Blood pressure control: mildly elevated Progress toward BP goal:  deteriorated Comments: Patient w/ recent issues of compliance for 1-2 weeks. Supposed to be taking Lisinopril 20 mg daily, Metoprolol 25 mg bid. Claims he has been taking these for the past couple days, but still wonder if this is true. Refilled all BP meds at new pharmacy; [facility] on [address].   Plan: Medications:  continue current medications Educational resources provided: brochure (has information) Self management tools provided:   Other plans: If BP still elevated at next clinic visit (3 months), increase one of his BP agents.  -Repeat BMP at next visit as well.

## 2014-12-03 NOTE — Progress Notes (Signed)
Internal Medicine Clinic Attending  Case discussed with Dr. Jones soon after the resident saw the patient.  We reviewed the resident's history and exam and pertinent patient test results.  I agree with the assessment, diagnosis, and plan of care documented in the resident's note. 

## 2015-02-10 ENCOUNTER — Encounter: Payer: Self-pay | Admitting: Internal Medicine

## 2015-02-24 ENCOUNTER — Encounter: Payer: Self-pay | Admitting: Internal Medicine

## 2015-02-24 ENCOUNTER — Ambulatory Visit (INDEPENDENT_AMBULATORY_CARE_PROVIDER_SITE_OTHER): Payer: Self-pay | Admitting: Internal Medicine

## 2015-02-24 VITALS — BP 147/75 | HR 65 | Temp 98.3°F | Ht 68.0 in | Wt 190.8 lb

## 2015-02-24 DIAGNOSIS — H409 Unspecified glaucoma: Secondary | ICD-10-CM | POA: Insufficient documentation

## 2015-02-24 DIAGNOSIS — E785 Hyperlipidemia, unspecified: Secondary | ICD-10-CM

## 2015-02-24 DIAGNOSIS — R7303 Prediabetes: Secondary | ICD-10-CM

## 2015-02-24 DIAGNOSIS — I252 Old myocardial infarction: Secondary | ICD-10-CM

## 2015-02-24 DIAGNOSIS — I2510 Atherosclerotic heart disease of native coronary artery without angina pectoris: Secondary | ICD-10-CM

## 2015-02-24 DIAGNOSIS — I10 Essential (primary) hypertension: Secondary | ICD-10-CM

## 2015-02-24 DIAGNOSIS — R7309 Other abnormal glucose: Secondary | ICD-10-CM

## 2015-02-24 LAB — BASIC METABOLIC PANEL WITH GFR
BUN: 19 mg/dL (ref 6–23)
CHLORIDE: 102 meq/L (ref 96–112)
CO2: 27 meq/L (ref 19–32)
Calcium: 9 mg/dL (ref 8.4–10.5)
Creat: 0.84 mg/dL (ref 0.50–1.35)
GFR, Est African American: >89 mL/min
GFR, Est Non African American: >89 mL/min
GLUCOSE: 125 mg/dL — AB (ref 70–99)
POTASSIUM: 4.7 meq/L (ref 3.5–5.3)
SODIUM: 138 meq/L (ref 135–145)

## 2015-02-24 MED ORDER — METOPROLOL TARTRATE 25 MG PO TABS: 25.0000 mg | ORAL_TABLET | Freq: Two times a day (BID) | ORAL | Status: DC

## 2015-02-24 MED ORDER — ASPIRIN EC 81 MG PO TBEC: 81.0000 mg | DELAYED_RELEASE_TABLET | Freq: Every day | ORAL | Status: DC

## 2015-02-24 MED ORDER — LISINOPRIL 40 MG PO TABS: 40.0000 mg | ORAL_TABLET | Freq: Every day | ORAL | Status: DC

## 2015-02-24 MED ORDER — LOVASTATIN 20 MG PO TABS: 20.0000 mg | ORAL_TABLET | Freq: Every day | ORAL | Status: DC

## 2015-02-24 MED ORDER — ASPIRIN EC 81 MG PO TBEC: 81.0000 mg | DELAYED_RELEASE_TABLET | Freq: Every day | ORAL | Status: AC

## 2015-02-24 NOTE — Progress Notes (Signed)
   Subjective:   Patient ID: Phillip Wells male   DOB: Mar 11, 1951 64 y.o.   MRN: [id number]  HPI: Phillip Wells is a 64 y.o. male w/ PMHx of CAD, HTN, HLD, presents to the clinic today for a follow-up visit regarding his blood pressure. Patient was last seen in clinic on 12/02/14 for management of his chronic medical conditions, at which time he had trouble obtaining his BP medications and his BP was slightly elevated at that time. Since then, He has been doing well. No acute issues, no chest pain, SOB, palpitations, dizziness, lightheadedness, nausea, or vomiting. Has h/o glaucoma, states he has had some mild blurry vision in his right eye, but states that this has been present for quite some time.   Past Medical History  Diagnosis Date  . Coronary artery disease     s/p DES to the LAD  . HTN (hypertension)   . Hyperlipemia   . Myocardial infarction 11/09/10  . Incomplete RBBB    Current Outpatient Prescriptions  Medication Sig Dispense Refill  . aspirin 325 MG tablet Take 325 mg by mouth daily.      Marland Kitchen. lisinopril (PRINIVIL,ZESTRIL) 20 MG tablet Take 1 tablet (20 mg total) by mouth daily. 30 tablet 2  . lovastatin (MEVACOR) 20 MG tablet Take 1 tablet (20 mg total) by mouth daily. 90 tablet 1  . metoprolol tartrate (LOPRESSOR) 25 MG tablet Take 1 tablet (25 mg total) by mouth 2 (two) times daily. TAKE ONE TABLET BY MOUTH TWICE DAILY 180 tablet 1  . nitroGLYCERIN (NITROSTAT) 0.4 MG SL tablet Place 0.4 mg under the tongue every 5 (five) minutes as needed for chest pain.     . polyvinyl alcohol (LIQUIFILM TEARS) 1.4 % ophthalmic solution Place 1 drop into both eyes as needed for dry eyes. 15 mL 1   No current facility-administered medications for this visit.    Review of Systems  General: Denies fever, diaphoresis, appetite change, and fatigue.  Respiratory: Denies SOB, cough, and wheezing.   Cardiovascular: Denies chest pain and palpitations.  Gastrointestinal:  Denies nausea, vomiting, abdominal pain, and diarrhea Musculoskeletal: Denies myalgias, arthralgias, back pain, and gait problem.  Neurological: Denies dizziness, syncope, weakness, lightheadedness, and headaches.  Psychiatric/Behavioral: Denies mood changes, sleep disturbance, and agitation.   Objective:   Physical Exam: Filed Vitals:   02/24/15 1550  BP: 147/75  Pulse: 65  Temp: 98.3 F (36.8 C)  TempSrc: Oral  Height: 5\' 8"  (1.727 m)  Weight: 190 lb 12.8 oz (86.546 kg)  SpO2: 100%    General: Spanish speaking male, alert, cooperative, NAD. HEENT: PERRL, EOMI. Moist mucus membranes. Mild conjunctival injection.  Neck: Full range of motion without pain, supple, no lymphadenopathy or carotid bruits Lungs: Clear to ascultation bilaterally, normal work of respiration, no wheezes, rales, rhonchi Heart: RRR, no murmurs, gallops, or rubs Abdomen: Soft, non-tender, non-distended, BS + Extremities: No cyanosis, clubbing, or edema Neurologic: Alert & oriented x3, cranial nerves II-XII intact, strength grossly intact, sensation intact to light touch   Assessment & Plan:   Please see problem based assessment and plan.

## 2015-02-24 NOTE — Patient Instructions (Signed)
General Instructions:  1. Please schedule follow up appointment for 6 months.   2. Please take all medications as previously prescribed with the following changes:  Start taking LOWER DOSE Aspirin; 81 mg daily instead of 325 mg daily.   Increase Lisinopril to 40 mg daily.   3. If you have worsening of your symptoms or new symptoms arise, please call the clinic [phone]), or go to the ER immediately if symptoms are severe.  You have done a great job in taking all your medications. Please continue to do this.  Please bring your medicines with you each time you come to clinic.  Medicines may include prescription medications, over-the-counter medications, herbal remedies, eye drops, vitamins, or other pills.   Progress Toward Treatment Goals:  Treatment Goal 02/24/2015  Hemoglobin A1C -  Blood pressure unchanged    Self Care Goals & Plans:  Self Care Goal 02/24/2015  Manage my medications take my medicines as prescribed; bring my medications to every visit; refill my medications on time  Eat healthy foods drink diet soda or water instead of juice or soda; eat more vegetables; eat foods that are low in salt; eat baked foods instead of fried foods  Be physically active -  Meeting treatment goals maintain the current self-care plan    Home Blood Glucose Monitoring 07/08/2014  Check my blood sugar no home glucose monitoring  When to check my blood sugar -     Care Management & Community Referrals:  Referral 02/24/2015  Referrals made for care management support none needed  Referrals made to community resources none

## 2015-02-25 NOTE — Assessment & Plan Note (Signed)
Recheck HbA1c at next visit

## 2015-02-25 NOTE — Assessment & Plan Note (Signed)
Patient denies any recent chest pain, SOB, diaphoresis, etc. Has been doing quite well since his NSTEMI and PCI.  -Continue Metoprolol 25 mg bid -Lisinopril 40 mg daily -Decrease ASA from 325 mg to 81 mg daily. Do not see any reason for patient to take high dose ASA.  -Mevacor 20 mg daily

## 2015-02-25 NOTE — Assessment & Plan Note (Signed)
BP Readings from Last 3 Encounters:  02/24/15 147/75  12/02/14 168/83  10/30/14 146/79    Lab Results  Component Value Date   NA 138 02/24/2015   K 4.7 02/24/2015   CREATININE 0.84 02/24/2015    Assessment: Blood pressure control: moderately elevated Progress toward BP goal:  unchanged Comments: Compliant w/ Lopressor 25 mg bid + Lisinopril 20 mg daily.   Plan: Medications:  Continue Metoprolol at 25 mg bid. Rate 60's. Increase Lisinopril to 40 mg daily.  Educational resources provided: brochure (denies need for) Self management tools provided:   Other plans: BMP w/ no significant abnormalities.

## 2015-02-25 NOTE — Assessment & Plan Note (Signed)
Refill Mevacor

## 2015-02-25 NOTE — Assessment & Plan Note (Signed)
Patient complaining of blurry vision in the right eye. Has seen [facility] in the past.  -advised follow up w/ ophtho; referral placed

## 2015-02-27 NOTE — Progress Notes (Signed)
Internal Medicine Clinic Attending  Case discussed with Dr. Jones at the time of the visit.  We reviewed the resident's history and exam and pertinent patient test results.  I agree with the assessment, diagnosis, and plan of care documented in the resident's note.  

## 2015-03-03 ENCOUNTER — Encounter: Payer: Self-pay | Admitting: Internal Medicine

## 2015-06-02 ENCOUNTER — Ambulatory Visit: Payer: Self-pay

## 2015-06-12 ENCOUNTER — Emergency Department (HOSPITAL_COMMUNITY)
Admission: EM | Admit: 2015-06-12 | Discharge: 2015-06-13 | Disposition: A | Discharge status: Home / Self Care | Payer: Self-pay | Attending: Emergency Medicine | Admitting: Emergency Medicine

## 2015-06-12 ENCOUNTER — Encounter (HOSPITAL_COMMUNITY): Payer: Self-pay | Admitting: *Deleted

## 2015-06-12 ENCOUNTER — Emergency Department (HOSPITAL_COMMUNITY): Payer: Self-pay

## 2015-06-12 DIAGNOSIS — M545 Low back pain, unspecified: Secondary | ICD-10-CM

## 2015-06-12 DIAGNOSIS — E785 Hyperlipidemia, unspecified: Secondary | ICD-10-CM | POA: Insufficient documentation

## 2015-06-12 DIAGNOSIS — Z87891 Personal history of nicotine dependence: Secondary | ICD-10-CM | POA: Insufficient documentation

## 2015-06-12 DIAGNOSIS — Z7982 Long term (current) use of aspirin: Secondary | ICD-10-CM | POA: Insufficient documentation

## 2015-06-12 DIAGNOSIS — Z79899 Other long term (current) drug therapy: Secondary | ICD-10-CM | POA: Insufficient documentation

## 2015-06-12 DIAGNOSIS — I252 Old myocardial infarction: Secondary | ICD-10-CM | POA: Insufficient documentation

## 2015-06-12 DIAGNOSIS — I2510 Atherosclerotic heart disease of native coronary artery without angina pectoris: Secondary | ICD-10-CM | POA: Insufficient documentation

## 2015-06-12 DIAGNOSIS — R519 Headache, unspecified: Secondary | ICD-10-CM

## 2015-06-12 DIAGNOSIS — R51 Headache: Secondary | ICD-10-CM | POA: Insufficient documentation

## 2015-06-12 DIAGNOSIS — R0781 Pleurodynia: Secondary | ICD-10-CM | POA: Insufficient documentation

## 2015-06-12 DIAGNOSIS — I10 Essential (primary) hypertension: Secondary | ICD-10-CM | POA: Insufficient documentation

## 2015-06-12 LAB — URINALYSIS, ROUTINE W REFLEX MICROSCOPIC
Bilirubin Urine: NEGATIVE
GLUCOSE, UA: NEGATIVE mg/dL
Hgb urine dipstick: NEGATIVE
KETONES UR: NEGATIVE mg/dL
Leukocytes, UA: NEGATIVE
NITRITE: NEGATIVE
PH: 6.5 (ref 5.0–8.0)
PROTEIN: NEGATIVE mg/dL
Specific Gravity, Urine: 1.021 (ref 1.005–1.030)
UROBILINOGEN UA: 0.2 mg/dL (ref 0.0–1.0)

## 2015-06-12 LAB — CBC WITH DIFFERENTIAL/PLATELET
Basophils Absolute: 0 10*3/uL (ref 0.0–0.1)
Basophils Relative: 0 % (ref 0–1)
Eosinophils Absolute: 0.1 10*3/uL (ref 0.0–0.7)
Eosinophils Relative: 2 % (ref 0–5)
HCT: 41.9 % (ref 39.0–52.0)
HEMOGLOBIN: 14.3 g/dL (ref 13.0–17.0)
LYMPHS ABS: 2.1 10*3/uL (ref 0.7–4.0)
Lymphocytes Relative: 28 % (ref 12–46)
MCH: 31.2 pg (ref 26.0–34.0)
MCHC: 34.1 g/dL (ref 30.0–36.0)
MCV: 91.5 fL (ref 78.0–100.0)
Monocytes Absolute: 0.8 10*3/uL (ref 0.1–1.0)
Monocytes Relative: 11 % (ref 3–12)
Neutro Abs: 4.4 10*3/uL (ref 1.7–7.7)
Neutrophils Relative %: 59 % (ref 43–77)
Platelets: 196 10*3/uL (ref 150–400)
RBC: 4.58 MIL/uL (ref 4.22–5.81)
RDW: 13.6 % (ref 11.5–15.5)
WBC: 7.4 10*3/uL (ref 4.0–10.5)

## 2015-06-12 LAB — COMPREHENSIVE METABOLIC PANEL
ALK PHOS: 59 U/L (ref 38–126)
ALT: 18 U/L (ref 17–63)
AST: 22 U/L (ref 15–41)
Albumin: 3.8 g/dL (ref 3.5–5.0)
Anion gap: 7 (ref 5–15)
BILIRUBIN TOTAL: 0.5 mg/dL (ref 0.3–1.2)
BUN: 20 mg/dL (ref 6–20)
CALCIUM: 8.8 mg/dL — AB (ref 8.9–10.3)
CO2: 24 mmol/L (ref 22–32)
CREATININE: 1.11 mg/dL (ref 0.61–1.24)
Chloride: 107 mmol/L (ref 101–111)
GFR calc Af Amer: >60 mL/min (ref >60)
GFR calc non Af Amer: >60 mL/min (ref >60)
GLUCOSE: 90 mg/dL (ref 65–99)
Potassium: 4.2 mmol/L (ref 3.5–5.1)
Sodium: 138 mmol/L (ref 135–145)
TOTAL PROTEIN: 6.8 g/dL (ref 6.5–8.1)

## 2015-06-12 NOTE — ED Provider Notes (Signed)
CSN: [id number]     Arrival date & time 06/12/15  2103 History   First MD Initiated Contact with Patient 06/12/15 2220     Chief Complaint  Patient presents with  . Flank Pain     (Consider location/radiation/quality/duration/timing/severity/associated sxs/prior Treatment) HPI Comments: History taken with interpreter phone. Patient with h/o NSTEMI in 2011 -- presents with complaints of left lateral rib pain and lower back pain for the past 2 weeks. Pain is dull and is made worse with movement or activity. Patient has not taken any medication for this pain. He denies injury. No fever, nausea, vomiting, or diarrhea. No chest pain or shortness of breath. No lower extremity color change, numbness, tingling, or weakness. Patient denies warning symptoms of back pain including: fecal incontinence, urinary retention or overflow incontinence, night sweats, waking from sleep with back pain, unexplained fevers or weight loss, h/o cancer, IVDU, recent trauma.    Patient also complains of headache which is currently resolved. Patient developed a mild generalized headache earlier this afternoon which became worse. Patient took Tylenol which resolved his symptoms. He states that this headache is similar to headaches he gets when his blood pressure is high. Patient has not taken his blood pressure medication today. Patient denies signs of stroke including: facial droop, slurred speech, aphasia, weakness/numbness in extremities, imbalance/trouble walking.    Patient is a 64 y.o. male presenting with flank pain. The history is provided by the patient and medical records.  Flank Pain Associated symptoms include chest pain (lateral lower L ribs). Pertinent negatives include no abdominal pain, coughing, diaphoresis, fever, nausea, neck pain, numbness, rash, vomiting or weakness.    Past Medical History  Diagnosis Date  . Coronary artery disease     s/p DES to the LAD  . HTN (hypertension)   . Hyperlipemia   .  Myocardial infarction 11/09/10  . Incomplete RBBB    Past Surgical History  Procedure Laterality Date  . Coronary stent placement  November 2011    DES to LAD   Family History  Problem Relation Age of Onset  . Heart disease Neg Hx    History  Substance Use Topics  . Smoking status: Former Smoker    Types: Cigarettes    Quit date: 01/31/1981  . Smokeless tobacco: Not on file  . Alcohol Use: No    Review of Systems  Constitutional: Negative for fever, diaphoresis and unexpected weight change.  Eyes: Negative for redness.  Respiratory: Negative for cough and shortness of breath.   Cardiovascular: Positive for chest pain (lateral lower L ribs). Negative for palpitations and leg swelling.  Gastrointestinal: Negative for nausea, vomiting, abdominal pain and constipation.       Neg for fecal incontinence  Genitourinary: Positive for flank pain. Negative for dysuria, hematuria and difficulty urinating.       Negative for urinary incontinence or retention  Musculoskeletal: Positive for back pain. Negative for neck pain.  Skin: Negative for rash.  Neurological: Negative for syncope, weakness, light-headedness and numbness.       Negative for saddle paresthesias   Psychiatric/Behavioral: The patient is not nervous/anxious.       Allergies  Review of patient's allergies indicates no known allergies.  Home Medications   Prior to Admission medications   Medication Sig Start Date End Date Taking? Authorizing Provider  aspirin EC 81 MG tablet Take 1 tablet (81 mg total) by mouth daily. 02/24/15 02/24/16 Yes Courtney ParisEden W Jones, MD  lisinopril (PRINIVIL,ZESTRIL) 40 MG tablet Take 1 tablet (  40 mg total) by mouth daily. 02/24/15 02/24/16 Yes Courtney Paris, MD  lovastatin (MEVACOR) 20 MG tablet Take 1 tablet (20 mg total) by mouth daily. 02/24/15 02/24/16 Yes Courtney Paris, MD  metoprolol tartrate (LOPRESSOR) 25 MG tablet Take 1 tablet (25 mg total) by mouth 2 (two) times daily. TAKE ONE TABLET BY  MOUTH TWICE DAILY 02/24/15  Yes Courtney Paris, MD  nitroGLYCERIN (NITROSTAT) 0.4 MG SL tablet Place 0.4 mg under the tongue every 5 (five) minutes as needed for chest pain.    Yes Historical Provider, MD  polyvinyl alcohol (LIQUIFILM TEARS) 1.4 % ophthalmic solution Place 1 drop into both eyes as needed for dry eyes. Patient not taking: Reported on 06/12/2015 12/02/14   Courtney Paris, MD   BP 152/91 mmHg  Pulse 63  Temp(Src) 98.3 F (36.8 C) (Oral)  Resp 19  SpO2 97% Physical Exam  Constitutional: He is oriented to person, place, and time. He appears well-developed and well-nourished.  HENT:  Head: Normocephalic and atraumatic.  Right Ear: Tympanic membrane, external ear and ear canal normal.  Left Ear: Tympanic membrane, external ear and ear canal normal.  Nose: Nose normal.  Mouth/Throat: Uvula is midline, oropharynx is clear and moist and mucous membranes are normal.  Eyes: Conjunctivae, EOM and lids are normal. Pupils are equal, round, and reactive to light.  Neck: Normal range of motion. Neck supple.  No meningismus.  Cardiovascular: Normal rate, regular rhythm and intact distal pulses.   No murmur heard. Pulmonary/Chest: Effort normal and breath sounds normal. No respiratory distress. He has no wheezes. He has no rales. He exhibits tenderness. He exhibits no deformity and no swelling. Right breast exhibits no tenderness. Left breast exhibits tenderness (lateral lower ribs).  Abdominal: Soft. There is no tenderness. There is no rebound, no guarding and no CVA tenderness.  Musculoskeletal: Normal range of motion.       Cervical back: He exhibits normal range of motion, no tenderness and no bony tenderness.       Thoracic back: He exhibits normal range of motion, no tenderness and no bony tenderness.       Lumbar back: He exhibits tenderness. He exhibits normal range of motion and no bony tenderness.       Back:  No step-off noted with palpation of spine.   Neurological: He is alert  and oriented to person, place, and time. He has normal strength and normal reflexes. No cranial nerve deficit or sensory deficit. He exhibits normal muscle tone. He displays a negative Romberg sign. Coordination and gait normal. GCS eye subscore is 4. GCS verbal subscore is 5. GCS motor subscore is 6.  5/5 strength in entire lower extremities bilaterally. No sensation deficit.   Skin: Skin is warm and dry.  Psychiatric: He has a normal mood and affect.  Nursing note and vitals reviewed.   ED Course  Procedures (including critical care time) Labs Review Labs Reviewed  COMPREHENSIVE METABOLIC PANEL - Abnormal; Notable for the following:    Calcium 8.8 (*)    All other components within normal limits  CBC WITH DIFFERENTIAL/PLATELET  URINALYSIS, ROUTINE W REFLEX MICROSCOPIC (NOT AT [facility])    Imaging Review Dg Chest 2 View  06/12/2015   CLINICAL DATA:  64 year old male with chest pain.  EXAM: CHEST  2 VIEW  COMPARISON:  Chest radiograph dated 07/28/2013  FINDINGS: The heart size and mediastinal contours are within normal limits. Both lungs are clear. The visualized skeletal structures are unremarkable.  IMPRESSION:  No active cardiopulmonary disease.   Electronically Signed   By: Elgie Collard M.D.   On: 06/12/2015 23:23     EKG Interpretation   Date/Time:  Saturday June 13 2015 00:27:28 EDT Ventricular Rate:  64 PR Interval:  185 QRS Duration: 99 QT Interval:  400 QTC Calculation: 413 R Axis:     Text Interpretation:  Sinus rhythm Confirmed by [facility]  MD, APRIL  ([id number]) on 06/13/2015 12:30:13 AM       11:56 PM Patient seen and examined. Work-up initiated. Medications ordered. Discussed with Dr. Karma Ganja. Agrees no head CT needed given resolution of symptoms.   Vital signs reviewed and are as follows: BP 152/91 mmHg  Pulse 63  Temp(Src) 98.3 F (36.8 C) (Oral)  Resp 19  SpO2 97%  12:10 AM Work-up is negative. Given history will get EKG just as a precaution. Sx are not  typical for ACS.   12:47 AM Discussed results with patient and family using interpreter phone.   Will d/c to home with Robaxin.   Patient urged to return with worsening symptoms or other concerns. Patient verbalized understanding and agrees with plan.        MDM   Final diagnoses:  Rib pain on left side  Left-sided low back pain without sciatica  Acute nonintractable headache, unspecified headache type  Essential hypertension   Rib pain/back pain: Patient with rib pain/back pain. It is positional in nature, worse with movement. Labs reassuring, CXR neg. No neurological deficits. Patient is ambulatory. No warning symptoms of back pain including: fecal incontinence, urinary retention or overflow incontinence, night sweats, waking from sleep with back pain, unexplained fevers or weight loss, h/o cancer, recent trauma. No concern for cauda equina, epidural abscess, or other serious cause of back pain. Conservative measures such as rest, ice/heat and pain medicine indicated with PCP follow-up if no improvement with conservative management.   HA: No thunderclap, similar to previous HA with high BP. Again, no neuro deficits or confusion. No HA on arrival to ED. No meningismus. No blood thinners other than  ASA. Do not feel imaging indicated given lack of symptoms.  HTN: Did not take meds today. No gross end-organ damage. Kidney fcn normal.      Renne Crigler, PA-C 06/13/15 2130  Jerelyn Scott, MD 06/13/15 [phone]

## 2015-06-12 NOTE — ED Notes (Signed)
The pt is c/o lt flank and rib pain for one week and pain in the back of his head and neck.  No known injury.

## 2015-06-13 MED ORDER — METHOCARBAMOL 500 MG PO TABS: 500.0000 mg | ORAL_TABLET | Freq: Three times a day (TID) | ORAL | Status: DC | PRN

## 2015-06-13 NOTE — ED Notes (Signed)
Pt. Left with all belongings and refused wheelchair 

## 2015-06-13 NOTE — Discharge Instructions (Signed)
Please read and follow all provided instructions.  Your diagnoses today include:  1. Rib pain on left side   2. Left-sided low back pain without sciatica   3. Acute nonintractable headache, unspecified headache type   4. Essential hypertension    Tests performed today include:  Vital signs - see below for your results today  Blood counts and electrolytes - normal  Urine test - normal  Medications prescribed:   Robaxin (methocarbamol) - muscle relaxer medication  DO NOT drive or perform any activities that require you to be awake and alert because this medicine can make you drowsy.   Take any prescribed medications only as directed.  Home care instructions:   Follow any educational materials contained in this packet  Please rest, use ice or heat on your back for the next several days  Do not lift, push, pull anything more than 10 pounds for the next week  Follow-up instructions: Please follow-up with your primary care provider in the next 1 week for further evaluation of your symptoms.   Return instructions:  SEEK IMMEDIATE MEDICAL ATTENTION IF YOU HAVE:  New numbness, tingling, weakness, or problem with the use of your arms or legs  Severe back pain not relieved with medications  Loss control of your bowels or bladder  Increasing pain in any areas of the body (such as chest or abdominal pain)  Shortness of breath, dizziness, or fainting.   Worsening nausea (feeling sick to your stomach), vomiting, fever, or sweats  Any other emergent concerns regarding your health   Additional Information:  Your vital signs today were: BP 143/85 mmHg   Pulse 60   Temp(Src) 98.3 F (36.8 C) (Oral)   Resp 18   SpO2 96% If your blood pressure (BP) was elevated above 135/85 this visit, please have this repeated by your doctor within one month. --------------

## 2015-07-14 ENCOUNTER — Other Ambulatory Visit: Payer: Self-pay | Admitting: Internal Medicine

## 2015-08-05 ENCOUNTER — Ambulatory Visit (INDEPENDENT_AMBULATORY_CARE_PROVIDER_SITE_OTHER): Payer: Self-pay | Admitting: Internal Medicine

## 2015-08-05 VITALS — BP 142/79 | HR 62 | Temp 97.5°F | Wt 189.2 lb

## 2015-08-05 DIAGNOSIS — H409 Unspecified glaucoma: Secondary | ICD-10-CM

## 2015-08-05 DIAGNOSIS — H9192 Unspecified hearing loss, left ear: Secondary | ICD-10-CM

## 2015-08-06 ENCOUNTER — Encounter: Payer: Self-pay | Admitting: Internal Medicine

## 2015-08-06 DIAGNOSIS — H9193 Unspecified hearing loss, bilateral: Secondary | ICD-10-CM | POA: Insufficient documentation

## 2015-08-06 NOTE — Progress Notes (Signed)
   Subjective:    Phillip Wells ID: Phillip Wells, male    DOB: 27-Oct-1951, 64 y.o.   MRN: [id number]  HPI Phillip Wells is a 64yo man with PMHx of HTN, CAD s/p DES, TIA, hyperlipidemia, and glaucoma who presents today for an acute visit. An interpreter was present during the interview.   Phillip Wells reports blurry vision in his right eye. He states this has been ongoing for the last 4-5 months. The blurriness is constant. He denies any eye pain, loss of vision, diplopia, headaches, discharge, tearing, or pain with eye movements. He states he was given some eye drops and used them for about 1 month but then were not helping so he stopped. He denies any difficulty driving at night. He reports he had an ophthalmology appointment, but showed up 5 minutes late to the appointment and was told he could not be seen. His appointment was not rescheduled.   Phillip Wells also notes decreased hearing on the left side. He states he is around a lot of loud noises at work since he is in [employer]. He reports his hearing was tested at work and he was told his left side was worse than the right, but nothing was done about it. He would like to have a hearing test and possibly get a hearing aide if he qualifies for one.    Review of Systems General: Denies fever, chills, night sweats, changes in weight, changes in appetite HEENT: Denies rhinorrhea, sore throat CV: Denies CP, palpitations, SOB, orthopnea Pulm: Denies SOB, cough, wheezing GI: Denies abdominal pain, nausea, vomiting, diarrhea, constipation, melena, hematochezia GU: Denies dysuria, hematuria, frequency Msk: Denies muscle cramps, joint pains Neuro: Denies weakness, numbness, tingling Skin: Denies rashes, bruising    Objective:   Physical Exam General: alert, sitting up in chair, pleasant, NAD HEENT: [place]/AT, EOMI, PERRL, conjunctiva normal. Hearing intact to finger sounds.  CV: RRR, no m/g/r Pulm: CTA bilaterally, breaths non-labored Ext: warm,  no peripheral edema Neuro: alert and oriented x 3. Visual fields intact.      Assessment & Plan:  Please refer to A&P documentation.

## 2015-08-06 NOTE — Assessment & Plan Note (Signed)
Patient with persistent right-sided blurry vision likely due to glaucoma. Patient was late to his ophthalmology appointment and therefore was never evaluated. Will refer patient back to ophtho. Emphasized importance of being early to the appointment and to let the clinic know if he needs help with transportation.  - Referral to ophtho placed

## 2015-08-06 NOTE — Assessment & Plan Note (Signed)
Patient with self-reported decreased hearing on left side. Difficult to assess on exam with language barrier. Will send for audiology test to see if he qualifies for a hearing aide.  - Referral to audiology placed

## 2015-08-10 NOTE — Progress Notes (Signed)
Internal Medicine Clinic Attending  Case discussed with Dr. Rivet soon after the resident saw the patient.  We reviewed the resident's history and exam and pertinent patient test results.  I agree with the assessment, diagnosis, and plan of care documented in the resident's note.  

## 2015-08-13 ENCOUNTER — Other Ambulatory Visit: Payer: Self-pay | Admitting: *Deleted

## 2015-08-18 MED ORDER — METHOCARBAMOL 500 MG PO TABS: 500.0000 mg | ORAL_TABLET | Freq: Three times a day (TID) | ORAL | Status: DC | PRN

## 2015-08-18 NOTE — Telephone Encounter (Signed)
Faxed to [facility].

## 2015-08-18 NOTE — Telephone Encounter (Signed)
Faxed Rx to [facility] - daughter aware. Stanton Kidney Sritha Chauncey RN 08/18/15 1:40PM

## 2015-09-11 ENCOUNTER — Other Ambulatory Visit: Payer: Self-pay | Admitting: Internal Medicine

## 2015-10-08 ENCOUNTER — Ambulatory Visit: Payer: Self-pay

## 2015-10-15 ENCOUNTER — Other Ambulatory Visit: Payer: Self-pay | Admitting: Internal Medicine

## 2015-10-26 ENCOUNTER — Ambulatory Visit: Payer: Self-pay | Attending: Audiology | Admitting: Audiology

## 2015-10-26 DIAGNOSIS — H9193 Unspecified hearing loss, bilateral: Secondary | ICD-10-CM | POA: Insufficient documentation

## 2015-10-26 DIAGNOSIS — R94128 Abnormal results of other function studies of ear and other special senses: Secondary | ICD-10-CM | POA: Insufficient documentation

## 2015-10-26 DIAGNOSIS — H9319 Tinnitus, unspecified ear: Secondary | ICD-10-CM | POA: Insufficient documentation

## 2015-10-26 DIAGNOSIS — R292 Abnormal reflex: Secondary | ICD-10-CM | POA: Insufficient documentation

## 2015-10-26 DIAGNOSIS — Z01118 Encounter for examination of ears and hearing with other abnormal findings: Secondary | ICD-10-CM | POA: Insufficient documentation

## 2015-10-26 NOTE — Patient Instructions (Signed)
Tinnitus (Tinnitus) El trmino tinnitus hace referencia a la percepcin de un sonido que no se corresponde con ninguna fuente real para ese sonido. A menudo se lo describe como zumbido de odos. Sin embargo, las personas que sufren esta afeccin pueden percibir diferentes ruidos. Una persona puede percibir el sonido en uno o en ambos odos.  Los sonidos del tinnitus pueden ser [place], fuertes o de intensidad intermedia. El tinnitus puede prolongarse pocos segundos o ser constante durante [address]. Puede desaparecer sin tratamiento y regresar en distintos momentos. Cuando el tinnitus es permanente u ocurre con frecuencia, puede causar otros problemas, por ejemplo, dificultad para dormir y para concentrarse. Casi todas las Environmental health practitioner tinnitus en algn momento. El tinnitus a Air cabin crew (crnico) o que regresa con frecuencia es un problema que puede requerir Psychologist, prison and probation services.  CAUSAS  A menudo se desconoce la causa del tinnitus. En algunos casos, puede ser [employer] de otros problemas u otras afecciones, entre ellas:   Exposicin a ruidos fuertes de [place], [place] u otras fuentes.  Prdida auditiva.  Infecciones de los odos o de los senos paranasales.  Acumulacin de cerumen.  Un objeto extrao en el odo.  Uso de ciertos medicamentos.  Consumo de alcohol y cafena.  Hipertensin arterial.  Cardiopatas.  Anemia.  Alergias.  Enfermedad de [place].  Problemas de tiroides.  Tumores.  Dilatacin de una porcin de un vaso sanguneo debilitado (aneurisma). SNTOMAS El principal sntoma de tinnitus es la percepcin de un sonido que no se corresponde con ninguna fuente, no proviene de Dealer. El sonido puede percibirse como lo siguiente:   Timbre.  Crepitacin.  Zumbido.  El soplido del aire, similar al sonido que se percibe en una caracola.  Sibilancia.  Silbido.  Chisporroteo.  Runrn.  Una corriente de agua.  Una nota musical  sostenida. DIAGNSTICO  El diagnstico de tinnitus se basa en los sntomas. El [employer] har un examen fsico. Se realizar un examen auditivo exhaustivo (audiometra) si el tinnitus:   Afecta un solo odo (unilateral).  Causa dificultades [id number].  Dura ms de . Adems, tal vez deba consultar a un mdico especialista en trastornos auditivos (audilogo). Pueden pedirle que responda un cuestionario para determinar la gravedad del tinnitus que padece. Se pueden hacer estudios para ayudar a Production assistant, radio causa y Sales promotion account executive otras enfermedades. Estos pueden incluir los siguientes:  Estudios de diagnstico por imgenes de la cabeza y el cerebro, por ejemplo:  Tomografa computarizada.  Resonancia magntica.  Un estudio de diagnstico por imgenes de los vasos sanguneos (angiografa). TRATAMIENTO  A veces, el tratamiento de una enfermedad preexistente hace que el tinnitus desaparezca. Si el tinnitus contina, probablemente debe realizar [employer] tratamientos, [place] otros:  Medicamentos, como determinados antidepresivos o pastillas para dormir.  Generadores de sonido para enmascarar el tinnitus. Estos incluyen los siguientes:  Aparatos de sonido de mesa que reproducen sonidos relajantes para ayudarlo a dormir.  Dispositivos inteligentes que se adaptan al odo y reproducen sonidos o [place].  Un pequeo dispositivo que [place] auriculares para emitir una seal con msica (estimulacin Designer, industrial/product). Con el tiempo, esto puede modificar las redes del cerebro y reducir la sensibilidad al tinnitus. Este dispositivo se [place] en los casos muy graves cuando ningn otro tratamiento resulta eficaz.  Terapia y orientacin para ayudarlo a controlar el estrs que significa vivir con tinnitus.  El uso de audfonos o implantes cocleares, si el tinnitus guarda relacin con la prdida de la audicin. INSTRUCCIONES PARA EL CUIDADO EN EL  HOGAR  Cuando sea posible, no permanezca en  lugares ruidosos y no se exponga a sonidos fuertes.  Use dispositivos de proteccin de la audicin, por ejemplo, tapones, cuando est expuesto a ruidos fuertes.  No consuma sustancias estimulantes, como nicotina, alcohol o cafena.  Ponga en prctica tcnicas para reducir el estrs, como meditacin, yoga o respiracin profunda.  Use un aparato de sonido de fondo, un humidificador u otros dispositivos para enmascarar el sonido del tinnitus.  Duerma con la cabeza levemente elevada. Esto puede reducir el impacto del tinnitus.  Intente descansar lo suficiente todas las noches. SOLICITE ATENCIN MDICA SI:  Tiene tinnitus en un solo odo.  El tinnitus se prolonga durante 3semanas o ms tiempo y no se detiene.  Las medidas de cuidados en el hogar no Comptrollerresultan eficaces.  Tiene tinnitus despus de sufrir una lesin en la cabeza.  Tiene tinnitus junto con alguno de estos sntomas:  Mareos.  Prdida del equilibrio.  Nuseas y vmitos.   Esta informacin no tiene Theme park managercomo fin reemplazar el consejo del mdico. Asegrese de hacerle al mdico cualquier pregunta que tenga.   Document Released: 11/28/2005 Document Revised: 12/19/2014 Elsevier Interactive Patient Education [employer] Inc2016 Elsevier Inc. Presbiacusia (Presbycusis) La prdida de la audicin relacionada con la edad (presbiacusia) afecta a casi uno de tres ancianos. Por lo general, comienza aproximadamente en la madurez y es ms frecuente en los hombres. Los cambios que la provocan tienen lugar en la cclea, una cavidad en el odo medio. La cclea contiene microvellosidades que convierten las vibraciones sonoras en impulsos elctricos que interpreta el cerebro. A medida que envejecemos, este tipo de prdida de la audicin se llama prdida auditiva neurosensorial. Es permanente y no se puede corregir con [place]. Las personas con este tipo de prdida auditiva probablemente necesitarn audfonos. CAUSAS La causa de la presbiacusia es la prdida  auditiva neurosensorial. Existen distintos tipos de prdida auditiva:  Prdida auditiva neurosensorial: este tipo de prdida auditiva sucede cuando los nervios del odo que registran los sonidos y envan seales al cerebro estn daados.  Prdida auditiva conductiva: este tipo de prdida auditiva ocurre cuando los sonidos no pueden llegar al odo interno por problemas en el odo medio o externo. La prdida [place]itiva conductiva puede presentarse si tiene un exceso de cera en el odo que atena los sonidos. Tambin ocurre si tiene una infeccin y algunas partes del odo estn hinchadas o llenas de lquido.  Prdida auditiva mixta: esta prdida auditiva aparece cuando ambos tipos de prdida [place] Nevisauditiva ocurren al Arrow Electronicsmismo tiempo. FACTORES DE RIESGO  La edad.  Sexo masculino.  Nivel socioeconmico bajo.  Exposicin al ruido.  Sustancias que son txicas para los rganos del odo relacionados con la audicin y el equilibrio (agentes ototxicos), como aminoglucsidos, agentes quimioteraputicos o metales pesados).  Ciertas infecciones.  Ciertas afecciones o enfermedades (como hipertensin, diabetes o trastornos inmunitarios).  Factores hormonales. SIGNOS Y SNTOMAS  Prdida lenta y progresiva de la audicin Personnel officeren personas mayores. El Powaygrado de prdida [place] Nevisauditiva es el mismo en ambos odos (simtrica). Esto suele afectar la percepcin de sonidos agudos.  Zumbido en los odos (tinitus).  Mareos. DIAGNSTICO El diagnstico de presbiacusia se basa en la historia clnica. El examen fsico (por lo general, con un otoscopio) puede ser til para Warehouse managerdeterminar el tipo de prdida [place]auditiva, adems de los posibles factores que contribuyen a la prdida de la audicin, como cera en los odos u otras causas. Las personas que sufren de prdida [place] Nevisauditiva deben realizarse un audiograma para confirmar el diagnstico, Chief Strategy Officerdeterminar  la gravedad y Paediatric nurse. TRATAMIENTO  La prdida [place] no se puede corregir con  [place]. Las siguientes opciones pueden ayudar a Mining engineer:  Audfonos.  Implantacin coclear.  Dispositivos de ayuda para la audicin.  Estos dispositivos se pueden asociar con los audfonos para uso telefnico o sistemas de modulacin de frecuencia que transmiten informacin sonora directamente al audfono.  Tambin pueden ser independientes de los audfonos. Las [employer] tctiles o visuales pueden compensar la falta de informacin [place], como luces intermitentes para el timbre de la [place].  Rehabilitacin auditiva. INSTRUCCIONES PARA EL CUIDADO EN EL HOGAR   Colquese frente a las personas con las que conversa para que pueda ver cmo se mueven los labios cuando hablan.  Observe indicios como los [employer] o las expresiones faciales de las personas mientras hablan con usted.  [place] o, si es posible, evite el ruido de fondo. SOLICITE ATENCIN MDICA SI:   Tiene cada vez mayor dificultad para escuchar.  Alguna persona cercana advierte que usted tiene problemas para Tax adviser o entender lo que [employer]. La presbiacusia ocurre gradualmente. Es probable que no note los cambios. SOLICITE ATENCIN MDICA DE INMEDIATO SI:  Pierde audicin de manera repentina (en unas horas o un da).  Siente entumecimiento.  Tiene dolor de odo o de cabeza asociado con la prdida [place].  Siente temblores.  Tiene desvanecimientos.  Tiene convulsiones.  Tiene zonas de debilidad en el cuerpo.   Esta informacin no tiene Theme park manager el consejo del mdico. Asegrese de hacerle al mdico cualquier pregunta que tenga.   Document Released: 11/28/2005 Document Revised: 09/18/2013 Elsevier Interactive Patient Education Yahoo! Inc.

## 2015-10-26 NOTE — Procedures (Signed)
Outpatient Audiology and [place]  [place], [place] [address]  [phone]   Audiological Evaluation  Patient Name: Phillip Wells  Status: Outpatient   DOB: 15-May-1951    Diagnosis: Hearing loss, tinnitus MRN: [id number] Date:  10/26/2015     Referent: Albin Felling, MD  History: Phillip Wells was seen for an audiological evaluation. He was accompanied by his family and a spanish interpreter.  Phillip Wells states that "he has noticed hearing loss over the past 2 years".  He feels that his hearing is worse "on the left side" and he "has difficulty with word understanding" if the speaker is "five feet away".  Phillip Wells states he "can hear but not understand".  Phillip Wells has a history of occupational noise exposure, currently working with "power tools making pallets" but he states that he wears hearing protection.  Phillip Wells also reports constant "high pitched hissing and beeping" but he can't tell if it's in one or both ears". He denies feeling off balance or experiencing vertigo. Medication is taken for blood pressure: Lizinopril and metoprolol.     Evaluation: Conventional pure tone audiometry from _0  - _1  with using insert earphones show symmetrical sensorineural hearing thresholds that range from 25-30 dBHL at _2 ; 25 dBHL at _3 ; 30 dBHL at _4 ; 40-50 dBHL at _5 ; 70 -79 dBL from _6  - _7 ; 55-60 dBHL at _8  and 65-70 dBHL at _9  bilaterally. Reliability is good Speech reception levels (repeating words near threshold) using recorded spanish spondee word lists:  Right ear: 30 dBHL.  Left ear:  30 dBHL Word recognition (at comfortably loud volumes) using recorded spanish word lists at 65 dBHL, in quiet.  Right ear: 96%.  Left ear:   82% Word recognition in minimal speech noise as background noise:  +5 dBHL  Right ear: 65%                              Left ear:  65%   Tympanometry (middle ear function) with ipsilateral acoustic reflexes shows normal middle ear pressure with symmetrical acoustic reflexes that are 85-95 dBHL from _10  - _11  and no response at _12  bilaterally. Acoustic reflex decay is negative bilaterally. Right ear: Normal (Type A) with volume of 2.1cc.  Left ear: Normal (Type A) with volume of 1.5cc.  CONCLUSION:      Phillip Wells has a slight to borderline mild low frequency hearing loss sloping to a severe high frequency hearing loss from _13  - _14  bilaterally except for bilateral improvement at _15  to a moderately severe loss.  The hearing loss appears sensorineural bilaterally. Retrocochlear testing is negative bilaterally.  This amount of hearing loss would adversely affect speech communication at normal conversational speech levels.   Word recognition is excellent in the right ear and good in the left ear in quiet at loud conversational speech levels bilaterally. In minimal background noise, word recognition drops to poor in each ear. Significant difficulty hearing in restaurants and other social gatherings is expected.      Phillip Wells may be an excellent candidate for amplification; therefore a hearing aid evaluation is recommended. Please note that Phillip Wells has "an orange card" which will help him financially at [facility] providers.  Since he may also qualify for financial assistance with a hearing aid EDS participating providers, referral to Dr. Ernesto Rutherford ENT or Dr. Lucia Gaskins who are in the same office as the [facility] (  [phone]) is recommended to provide medical clearance prior to hearing aid fitting since he has tinnitus.  Please note that [facility] is also an EDS hearing aid provider.   RECOMMENDATIONS: 1.  Equipment Distribution Services in [place] may help with obtaining one hearing aid or one captioned telephone if hearing loss and financial qualifications are met.  Please  contact Robin Searing at [phone] .  List of Providers for EDS Hearing Aid Distribution [facility] June 11, 2012 - June 10, 2014. The following hearing aid companies have contracted with [place] to fit hearing aids for eligible applicants. Representatives of these companies are not [facility] employees. Inclusion on this list means the company agrees to the terms of contract pricing and inclusion is not an endorsement of their services over those who are not contracted with the division. An applicant may choose any provider listed from your region to assist in obtaining hearing aids through this service. If problems arise during this process, please contact the [place] that provided the application.  [facility] Vendor List 1 Updated; April 08, 2013 [place] [facility] [phone]  2.   Medical clearance by an ENT prior to a hearing aid evaluation such as with Dr. Ernesto Rutherford, ENT to evaluate tinnitus.  3.   Strategies that help improve hearing include: A) Face the speaker directly. Optimal is having the speakers face well - lit.  Unless amplified, being within 3-6 feet of the speaker will enhance word recognition. B) Avoid having the speaker back-lit as this will minimize the ability to use cues from lip-reading, facial expression and gestures. C)  Word recognition is poorer in background noise. For optimal word recognition, turn off the TV, radio or noisy fan when engaging in conversation. In a restaurant, try to sit away from noise sources and close to the primary speaker.  D)  Ask for topic clarification from time to time in order to remain in the conversation.  Most people don't mind repeating or clarifying a point when asked.  If needed, explain the difficulty hearing in background noise or hearing loss. 4.  Continue to use hearing protection during noisy activities such as using a weed eater, moving the lawn, shooting,  etc.    Musician's plugs, are available from [employer].com for music related hearing protection because there is no distortion.  Other hearing protection, such as sponge plugs (available at pharmacies) or earmuffs (available at sporting goods stores or department stores such as Paediatric nurse) are useful for noisy activities and venues.  Deborah L. Heide Spark, Au.D., CCC-A Doctor of Audiology 10/26/2015

## 2015-11-03 ENCOUNTER — Other Ambulatory Visit: Payer: Self-pay | Admitting: Internal Medicine

## 2015-11-03 DIAGNOSIS — H409 Unspecified glaucoma: Secondary | ICD-10-CM

## 2015-11-14 ENCOUNTER — Other Ambulatory Visit: Payer: Self-pay | Admitting: Internal Medicine

## 2016-01-18 ENCOUNTER — Other Ambulatory Visit: Payer: Self-pay | Admitting: Internal Medicine

## 2016-01-18 NOTE — Telephone Encounter (Signed)
Last 8/24, was to f/u 10/2015 called today and scheduled for 2/21 at 1545

## 2016-01-21 ENCOUNTER — Encounter: Payer: Self-pay | Admitting: Internal Medicine

## 2016-02-02 ENCOUNTER — Ambulatory Visit (INDEPENDENT_AMBULATORY_CARE_PROVIDER_SITE_OTHER): Payer: Self-pay | Admitting: Internal Medicine

## 2016-02-02 VITALS — BP 152/79 | HR 69 | Temp 98.2°F | Ht 68.0 in | Wt 188.8 lb

## 2016-02-02 DIAGNOSIS — R053 Chronic cough: Secondary | ICD-10-CM

## 2016-02-02 DIAGNOSIS — I10 Essential (primary) hypertension: Secondary | ICD-10-CM

## 2016-02-02 DIAGNOSIS — K219 Gastro-esophageal reflux disease without esophagitis: Secondary | ICD-10-CM

## 2016-02-02 DIAGNOSIS — H409 Unspecified glaucoma: Secondary | ICD-10-CM

## 2016-02-02 DIAGNOSIS — Z1211 Encounter for screening for malignant neoplasm of colon: Secondary | ICD-10-CM

## 2016-02-02 DIAGNOSIS — I2510 Atherosclerotic heart disease of native coronary artery without angina pectoris: Secondary | ICD-10-CM

## 2016-02-02 DIAGNOSIS — R1013 Epigastric pain: Secondary | ICD-10-CM

## 2016-02-02 DIAGNOSIS — Z79899 Other long term (current) drug therapy: Secondary | ICD-10-CM

## 2016-02-02 DIAGNOSIS — R05 Cough: Secondary | ICD-10-CM

## 2016-02-02 DIAGNOSIS — R7303 Prediabetes: Secondary | ICD-10-CM

## 2016-02-02 DIAGNOSIS — E785 Hyperlipidemia, unspecified: Secondary | ICD-10-CM

## 2016-02-02 DIAGNOSIS — Z7982 Long term (current) use of aspirin: Secondary | ICD-10-CM

## 2016-02-02 DIAGNOSIS — I25119 Atherosclerotic heart disease of native coronary artery with unspecified angina pectoris: Secondary | ICD-10-CM

## 2016-02-02 DIAGNOSIS — Z0000 Encounter for general adult medical examination without abnormal findings: Secondary | ICD-10-CM

## 2016-02-02 LAB — POCT GLYCOSYLATED HEMOGLOBIN (HGB A1C): HEMOGLOBIN A1C: 6.3

## 2016-02-02 LAB — GLUCOSE, CAPILLARY: Glucose-Capillary: 146 mg/dL — ABNORMAL HIGH (ref 65–99)

## 2016-02-02 MED ORDER — PANTOPRAZOLE SODIUM 40 MG PO TBEC: 40.0000 mg | DELAYED_RELEASE_TABLET | Freq: Every day | ORAL | Status: DC

## 2016-02-02 NOTE — Patient Instructions (Signed)
-   Start taking Protonix 40 mg daily - Lab work today - Bring in stool cards once done with all of them. Remember you have to use 3 separate bowel movements on 3 different days.   General Instructions:   Please bring your medicines with you each time you come to clinic.  Medicines may include prescription medications, over-the-counter medications, herbal remedies, eye drops, vitamins, or other pills.   Progress Toward Treatment Goals:  Treatment Goal 02/24/2015  Hemoglobin A1C -  Blood pressure unchanged    Self Care Goals & Plans:  Self Care Goal 02/02/2016  Manage my medications take my medicines as prescribed; bring my medications to every visit; refill my medications on time  Eat healthy foods drink diet soda or water instead of juice or soda; eat more vegetables; eat foods that are low in salt; eat baked foods instead of fried foods  Be physically active find an activity I enjoy; take a walk every day  Meeting treatment goals -    Home Blood Glucose Monitoring 07/08/2014  Check my blood sugar no home glucose monitoring  When to check my blood sugar -     Care Management & Community Referrals:  Referral 02/24/2015  Referrals made for care management support none needed  Referrals made to community resources none

## 2016-02-03 ENCOUNTER — Encounter: Payer: Self-pay | Admitting: Internal Medicine

## 2016-02-03 ENCOUNTER — Other Ambulatory Visit: Payer: Self-pay | Admitting: Internal Medicine

## 2016-02-03 DIAGNOSIS — R05 Cough: Secondary | ICD-10-CM | POA: Insufficient documentation

## 2016-02-03 DIAGNOSIS — K219 Gastro-esophageal reflux disease without esophagitis: Secondary | ICD-10-CM | POA: Insufficient documentation

## 2016-02-03 DIAGNOSIS — R053 Chronic cough: Secondary | ICD-10-CM | POA: Insufficient documentation

## 2016-02-03 DIAGNOSIS — H409 Unspecified glaucoma: Secondary | ICD-10-CM

## 2016-02-03 LAB — LIPID PANEL
CHOLESTEROL TOTAL: 192 mg/dL (ref 100–199)
Chol/HDL Ratio: 4.6 ratio units (ref 0.0–5.0)
HDL: 42 mg/dL (ref >39)
LDL Calculated: 105 mg/dL — ABNORMAL HIGH (ref 0–99)
TRIGLYCERIDES: 223 mg/dL — AB (ref 0–149)
VLDL CHOLESTEROL CAL: 45 mg/dL — AB (ref 5–40)

## 2016-02-03 NOTE — Assessment & Plan Note (Signed)
Cough most likely related to his untreated GERD, but he is also on an ACE inhibitor. Will try a PPI and if this does not help his cough then can consider switching his ACE to an ARB.

## 2016-02-03 NOTE — Assessment & Plan Note (Signed)
Given stool cards.  

## 2016-02-03 NOTE — Progress Notes (Signed)
   Subjective:    Patient ID: Phillip Wells, male    DOB: Dec 21, 1950, 65 y.o.   MRN: [id number]  HPI Phillip Wells is a 65yo man with PMHx of HTN, CAD (NSTEMI s/p DES to LAD), and hyperlipidemia who presents today for follow up of his HTN. A Spanish interpreter was used to facilitate the appointment.   HTN: BP mildly elevated today at 152/79. He takes Lisinopril 40 mg daily and Lopressor 25 mg BID.   CAD: He denies any chest pain, SOB, or palpitations. He is taking ASA, lisinopril, lopressor, and lovastatin.   Hyperlipidemia: Last lipid panel in July 2015 with chol 254, HDL 42, and LDL 138. He takes lovastatin 20 mg daily.   Prediabetes: Has had elevated blood sugars in the past. His A1c was checked in July 2015 and was mildly elevated at 5.7. He denies any polyuria, polydipsia, or burning/tingling in his extremities.   Abdominal Pain: He reports a cramping, burning abdominal pain that started yesterday. He states the pain is intermittent and mild, about a 2/10 in severity. He reports 1 loose stool yesterday. He denies associated fevers, nausea, vomiting, or blood in his stool. He notes having this pain before and when he took a pill the pain had gone away. He does not remember the name of the pill. He denies excessive NSAID use. He does not drink alcohol.   Persistent Cough: Reports he came down with a cold about 2 months ago and has not been able to get rid of the cough since that time. He reports the cough is mostly dry, but occasionally will be productive with yellow sputum. He denies associated fevers, SOB, sore throat, wheezing. He is a former smoker (quit 30+ years ago).   Cloudy Vision in R Eye: Reports he is still experiencing right-sided blurry/cloudy vision. This has been ongoing for the last 8 months. He was referred to ophtho previously, but per office he was late so could not be seen. Per patient, he was 5 mins early to appointment but was told they did not want to see  him. He denies associated eye pain or discharge.    Review of Systems General: Denies night sweats, changes in weight, changes in appetite HEENT: Denies headaches, ear pain CV: Denies orthopnea Pulm: See HPI GI: Denies constipation GU: Denies dysuria, hematuria, frequency Msk: Denies muscle cramps, joint pains Neuro: Denies weakness Skin: Denies rashes, bruising Psych: Denies depression, anxiety, hallucinations    Objective:   Physical Exam General: alert, sitting up, NAD HEENT: [place]/AT, EOMI, PERRL, pharynx non-erythematous, mucus membranes moist CV: RRR, no m/g/r Pulm: CTA bilaterally, breaths non-labored Abd: BS+, soft, non-distended, non-tender Ext: warm, no peripheral edema Neuro: alert and oriented x 3     Assessment & Plan:  Please refer to A&P documentation.

## 2016-02-03 NOTE — Assessment & Plan Note (Signed)
Difficult situation as unclear if missed last ophtho appointment. I am concerned he wasn't seen due to his lack of health insurance. Will try referring him to another ophtho provider.

## 2016-02-03 NOTE — Assessment & Plan Note (Signed)
Will recheck an A1c today

## 2016-02-03 NOTE — Assessment & Plan Note (Signed)
Ideally his LDL level should be less than 70 given his CAD. He should be on a moderate to high intensity statin but we are limited due to finances.  - Continue lovastatin 20 mg daily. Can discuss increasing to 40 mg daily. - Check lipids today

## 2016-02-03 NOTE — Assessment & Plan Note (Signed)
BP Readings from Last 3 Encounters:  02/02/16 152/79  08/05/15 142/79  06/13/15 143/85    Lab Results  Component Value Date   NA 138 06/12/2015   K 4.2 06/12/2015   CREATININE 1.11 06/12/2015    Assessment: Blood pressure control: mildly elevated Progress toward BP goal:  unchanged Comments: BP mildly elevated today but likely secondary to his acute abdominal pain.   Plan: Medications:  Continue Lisinopril 40 mg daily and Lopressor 25 mg BID Other plans:  - If elevated at next visit can consider additional antihypertensive

## 2016-02-03 NOTE — Assessment & Plan Note (Addendum)
His abdominal pain in addition to his mostly dry cough seem most consistent with GERD. He was previously on omeprazole. Unclear why this was stopped.  - Will restart a PPI to see if this alleviates his symptoms. Start Protonix 40 mg daily - Follow up in 3 months to reassess symptoms - If continues to have epigastric pain can increase to BID dosing but may also want to consider testing for H pylori

## 2016-02-03 NOTE — Assessment & Plan Note (Signed)
Stable. Continue ASA, Lopressor, statin, and Lisinopril

## 2016-02-04 NOTE — Progress Notes (Signed)
Internal Medicine Clinic Attending  Case discussed with Dr. Rivet soon after the resident saw the patient.  We reviewed the resident's history and exam and pertinent patient test results.  I agree with the assessment, diagnosis, and plan of care documented in the resident's note.  

## 2016-02-15 ENCOUNTER — Other Ambulatory Visit (INDEPENDENT_AMBULATORY_CARE_PROVIDER_SITE_OTHER): Payer: Self-pay

## 2016-02-15 DIAGNOSIS — Z1211 Encounter for screening for malignant neoplasm of colon: Secondary | ICD-10-CM

## 2016-02-15 LAB — POC HEMOCCULT BLD/STL (HOME/3-CARD/SCREEN)
Card #2 Fecal Occult Blod, POC: NEGATIVE
Card #3 Fecal Occult Blood, POC: NEGATIVE
FECAL OCCULT BLD: NEGATIVE

## 2016-02-16 ENCOUNTER — Other Ambulatory Visit: Payer: Self-pay | Admitting: Internal Medicine

## 2016-02-17 NOTE — Telephone Encounter (Signed)
Got 6 month supply in Dec so should have refills. Needs May / June appt [facility]

## 2016-03-17 ENCOUNTER — Other Ambulatory Visit: Payer: Self-pay | Admitting: Internal Medicine

## 2016-03-17 DIAGNOSIS — K219 Gastro-esophageal reflux disease without esophagitis: Secondary | ICD-10-CM

## 2016-03-17 MED ORDER — METOPROLOL TARTRATE 25 MG PO TABS: 25.0000 mg | ORAL_TABLET | Freq: Two times a day (BID) | ORAL | Status: DC

## 2016-03-17 MED ORDER — PANTOPRAZOLE SODIUM 40 MG PO TBEC: 40.0000 mg | DELAYED_RELEASE_TABLET | Freq: Every day | ORAL | Status: DC

## 2016-04-12 ENCOUNTER — Encounter: Payer: Self-pay | Admitting: Internal Medicine

## 2016-06-13 ENCOUNTER — Other Ambulatory Visit: Payer: Self-pay | Admitting: Internal Medicine

## 2016-06-27 ENCOUNTER — Other Ambulatory Visit: Payer: Self-pay

## 2016-06-27 MED ORDER — METOPROLOL TARTRATE 25 MG PO TABS: 25.0000 mg | ORAL_TABLET | Freq: Two times a day (BID) | ORAL | Status: DC

## 2016-06-27 MED ORDER — LISINOPRIL 40 MG PO TABS: 40.0000 mg | ORAL_TABLET | Freq: Every day | ORAL | Status: DC

## 2016-06-27 MED ORDER — LOVASTATIN 20 MG PO TABS: 20.0000 mg | ORAL_TABLET | Freq: Every day | ORAL | Status: DC

## 2016-06-27 NOTE — Telephone Encounter (Signed)
Requesting lisinopril, lovastatin and metoprolol to be filled @ [facility] on [address].

## 2016-08-02 ENCOUNTER — Encounter: Payer: Self-pay | Admitting: Internal Medicine

## 2016-08-02 ENCOUNTER — Ambulatory Visit: Payer: Self-pay | Admitting: Pharmacist

## 2016-08-02 ENCOUNTER — Ambulatory Visit (INDEPENDENT_AMBULATORY_CARE_PROVIDER_SITE_OTHER): Payer: Self-pay | Admitting: Internal Medicine

## 2016-08-02 VITALS — BP 127/64 | HR 72 | Temp 98.1°F | Resp 20 | Wt 187.7 lb

## 2016-08-02 DIAGNOSIS — Z87891 Personal history of nicotine dependence: Secondary | ICD-10-CM

## 2016-08-02 DIAGNOSIS — I10 Essential (primary) hypertension: Secondary | ICD-10-CM

## 2016-08-02 DIAGNOSIS — K219 Gastro-esophageal reflux disease without esophagitis: Secondary | ICD-10-CM

## 2016-08-02 DIAGNOSIS — Z79899 Other long term (current) drug therapy: Secondary | ICD-10-CM

## 2016-08-02 DIAGNOSIS — H918X3 Other specified hearing loss, bilateral: Secondary | ICD-10-CM

## 2016-08-02 DIAGNOSIS — H9193 Unspecified hearing loss, bilateral: Secondary | ICD-10-CM

## 2016-08-02 DIAGNOSIS — H409 Unspecified glaucoma: Secondary | ICD-10-CM

## 2016-08-02 NOTE — Assessment & Plan Note (Signed)
His GERD symptoms returned after stopping Protonix. Will check an H pylori today since has not been tested in the past. Have asked Dr. Selena BattenKim [facility] would like medications refilled.

## 2016-08-02 NOTE — Assessment & Plan Note (Signed)
BP well controlled. Continue Lisinopril 40 mg daily and Lopressor 25 mg BID.

## 2016-08-02 NOTE — Assessment & Plan Note (Signed)
He had an audiology test done in Nov 2016 which revealed "borderline mild low frequency hearing loss sloping to a severe high frequency hearing loss from 2000Hz  - 8000Hz  bilaterally except for bilateral improvement at 6000Hz  to a moderately severe loss.  The hearing loss appears sensorineural bilaterally." Will consult social work to see if we can help him get access to hearing aides.

## 2016-08-02 NOTE — Patient Instructions (Signed)
General Instructions: - Please bring back stool sample for H pylori test - We will have our social worker contact you about the hearing aide  Please bring your medicines with you each time you come to clinic.  Medicines may include prescription medications, over-the-counter medications, herbal remedies, eye drops, vitamins, or other pills.   Progress Toward Treatment Goals:  Treatment Goal 02/02/2016  Hemoglobin A1C -  Blood pressure unchanged    Self Care Goals & Plans:  Self Care Goal 08/02/2016  Manage my medications take my medicines as prescribed  Eat healthy foods eat baked foods instead of fried foods; eat foods that are low in salt  Be physically active find an activity I enjoy  Meeting treatment goals -    Home Blood Glucose Monitoring 07/08/2014  Check my blood sugar no home glucose monitoring  When to check my blood sugar -     Care Management & Community Referrals:  Referral 02/24/2015  Referrals made for care management support none needed  Referrals made to community resources none

## 2016-08-02 NOTE — Progress Notes (Signed)
   CC: Hypertension  HPI:  [name] is a 65 y.o. man with PMHx of HTN, CAD, and TIA who presents today for follow up of his hypertension. A Spanish interpretor was used to help facilitate the conversation.   HTN: BP well controlled ata 127/64. He takes Lisinopril 40 mg daily and Lopressor 25 mg BID. I did confirm with him that he is taking these medications and able to afford them.   GERD: Protonix was restarted at his last visit and he did note improvement in his abdominal pain and nausea. However, he ran out about 1 month ago and has noticed return of his symptoms. He did not refill the Protonix because it was too expensive. He describes having a burning sensation "on the top of my stomach" as well as nausea.   Glaucoma: Per his last Ophtho visit with [facility] he was diagnosed with bilateral open angle glaucoma. He was supposed to start Lumigan but is currently not taking any eye drops. He reports having cloudy ("like smoke") vision in his right eye. Denies any vision changes in his left eye. He denies any sensitivity or decreased vision with light. Denies any pain in his eyes. He reports he saw an Ophthalmologist about 6 months ago (cannot recall name or practice name) and was told there was nothing that could be done and that they did not have the machine needed to help him.    Decreased Hearing: Reports he had a screening test done at work and was told he had decreased hearing in both ears. He was also told that he would need a hearing aide. He reports some occasional dizziness but denies vertigo. He wants to know how he can get a hearing aide as he heard they are expensive.   Past Medical History:  Diagnosis Date  . Coronary artery disease    s/p DES to the LAD  . HTN (hypertension)   . Hyperlipemia   . Incomplete RBBB   . Myocardial infarction (HCC) 11/09/10    Review of Systems:  Negative except per HPI.   Physical Exam:  Vitals:   08/02/16 1435  BP:  127/64  Pulse: 72  Resp: 20  Temp: 98.1 F (36.7 C)  TempSrc: Oral  SpO2: 100%  Weight: 187 lb 11.2 oz (85.1 kg)   General: elderly man sitting up, NAD, pleasant HEENT: /AT, EOMI, PERRL, sclera anicteric, mucus membranes moist CV: RRR, no m/g/r Pulm: CTA bilaterally, breaths non-labored Abd: BS+, soft, mild tenderness in epigastrium, no rebound or guarding, non-distended  Ext: warm, no edema  Neuro: alert and oriented x 3  Assessment & Plan:   See Encounters Tab for problem based charting.  Patient discussed with Dr. Criselda PeachesMullen

## 2016-08-02 NOTE — Assessment & Plan Note (Signed)
Will see if we can get him back into Ophtho at [facility]. I have consulted social work to help facilitate this. He is an orange card patient which makes Ophtho referral more difficult.

## 2016-08-03 MED ORDER — METOPROLOL TARTRATE 25 MG PO TABS: 25.0000 mg | ORAL_TABLET | Freq: Two times a day (BID) | ORAL | 2 refills | Status: DC

## 2016-08-03 MED ORDER — PANTOPRAZOLE SODIUM 40 MG PO TBEC: 40.0000 mg | DELAYED_RELEASE_TABLET | Freq: Every day | ORAL | 2 refills | Status: DC

## 2016-08-03 MED ORDER — LISINOPRIL 40 MG PO TABS: 40.0000 mg | ORAL_TABLET | Freq: Every day | ORAL | 2 refills | Status: DC

## 2016-08-03 MED ORDER — NITROGLYCERIN 0.4 MG SL SUBL: 0.4000 mg | SUBLINGUAL_TABLET | SUBLINGUAL | 2 refills | Status: DC | PRN

## 2016-08-03 MED ORDER — ASPIRIN EC 81 MG PO TBEC: 81.0000 mg | DELAYED_RELEASE_TABLET | Freq: Every day | ORAL | 2 refills | Status: DC

## 2016-08-03 MED ORDER — ATORVASTATIN CALCIUM 20 MG PO TABS: 20.0000 mg | ORAL_TABLET | Freq: Every day | ORAL | 2 refills | Status: DC

## 2016-08-03 MED ORDER — OMEPRAZOLE 20 MG PO CPDR: 20.0000 mg | DELAYED_RELEASE_CAPSULE | Freq: Every day | ORAL | 3 refills | Status: DC

## 2016-08-03 MED FILL — PANTOPRAZOLE SOD DR 40 MG T: 40 | 30 days supply | Qty: 30 | Fill #0

## 2016-08-03 MED FILL — ATORVASTATIN 20 MG TABLET: 20 | 30 days supply | Qty: 30 | Fill #0

## 2016-08-03 MED FILL — NITROGLYCERIN 0.4 MG TAB SL: 0.4 | 13 days supply | Qty: 25 | Fill #0

## 2016-08-03 MED FILL — METOPROLOL TARTRATE 25 MG T: 25 | 30 days supply | Qty: 60 | Fill #0

## 2016-08-03 MED FILL — LISINOPRIL 40 MG TABLET: 40 | 30 days supply | Qty: 30 | Fill #0

## 2016-08-03 NOTE — Addendum Note (Signed)
Addended by: Mliss FritzKIM, JENNIFER J on: 08/03/2016 08:43 PM   Modules accepted: Orders

## 2016-08-03 NOTE — Progress Notes (Signed)
Physician pharmacist co-visit  CV risk reduction for indigent patient, referred to [facility] outpatient pharmacy, aspirin added, formulary interchange per pharmacy.  Patient/family was notified and verbalized understanding.  

## 2016-08-04 ENCOUNTER — Telehealth: Payer: Self-pay | Admitting: Licensed Clinical Social Worker

## 2016-08-04 LAB — H. PYLORI ANTIGEN, STOOL: H pylori Ag, Stl: NEGATIVE

## 2016-08-04 NOTE — Telephone Encounter (Signed)
[name] was referred to CSW for community resources for vision and hearing.  Pt is currently uninsured, but has the [employer] Card for access to [employer] specialist.  CSW attempted to contact [name] utilizing telephonic interpreter.  CSW left message notifying [name] this worker will place information on [employer] in the mail.  Due to patients hearing deficit, written community may be most efficient. See letter mailed 08/04/16.

## 2016-08-05 MED ORDER — PANTOPRAZOLE SODIUM 40 MG PO TBEC: 40.0000 mg | DELAYED_RELEASE_TABLET | Freq: Every day | ORAL | 1 refills | Status: DC

## 2016-08-05 NOTE — Progress Notes (Signed)
Internal Medicine Clinic Attending  Case discussed with Dr. Rivet soon after the resident saw the patient.  We reviewed the resident's history and exam and pertinent patient test results.  I agree with the assessment, diagnosis, and plan of care documented in the resident's note.  

## 2016-08-05 NOTE — Addendum Note (Signed)
Addended by: Mliss FritzKIM, Lyndsey Demos J on: 08/05/2016 02:28 PM   Modules accepted: Orders

## 2016-09-13 ENCOUNTER — Encounter: Payer: Self-pay | Admitting: Pediatric Intensive Care

## 2016-10-03 MED FILL — LISINOPRIL 40 MG TABLET: 40 | 30 days supply | Qty: 30 | Fill #1

## 2016-10-03 MED FILL — ATORVASTATIN 20 MG TABLET: 20 | 30 days supply | Qty: 30 | Fill #1

## 2016-10-03 MED FILL — METOPROLOL TARTRATE 25 MG T: 25 | 30 days supply | Qty: 60 | Fill #1

## 2016-10-03 MED FILL — NITROGLYCERIN 0.4 MG TAB SL: 0.4 | 13 days supply | Qty: 25 | Fill #1

## 2016-10-03 MED FILL — PANTOPRAZOLE SOD DR 40 MG T: 40 | 30 days supply | Qty: 30 | Fill #1

## 2016-10-04 IMAGING — DX DG CHEST 2V
2 series · 2 of 2 positions shown · non-contrast
Comparison: Chest radiograph dated 07/28/2013

CLINICAL DATA: 64-year-old male with chest pain.

EXAM:
CHEST  2 VIEW

[chest pa]
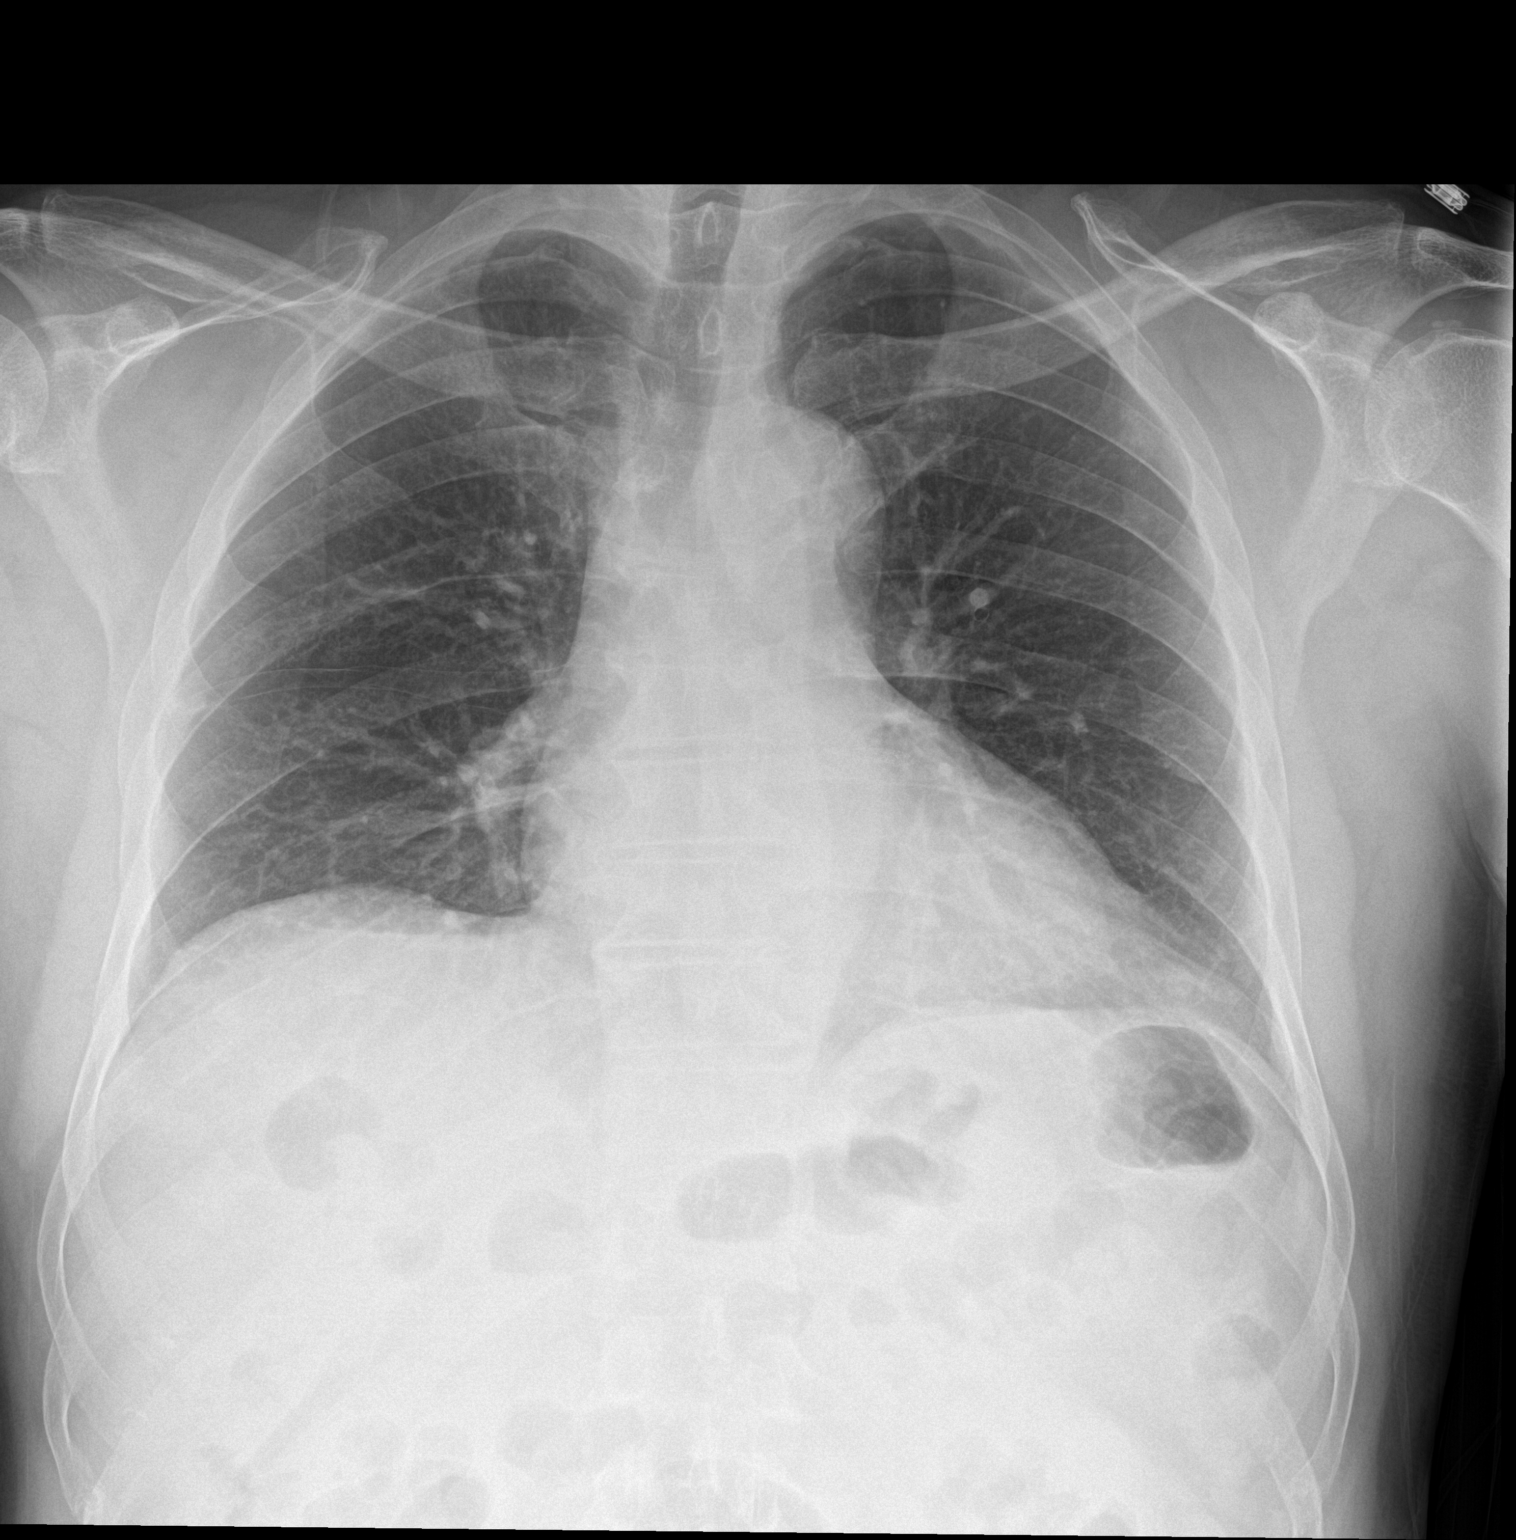

[chest lat]
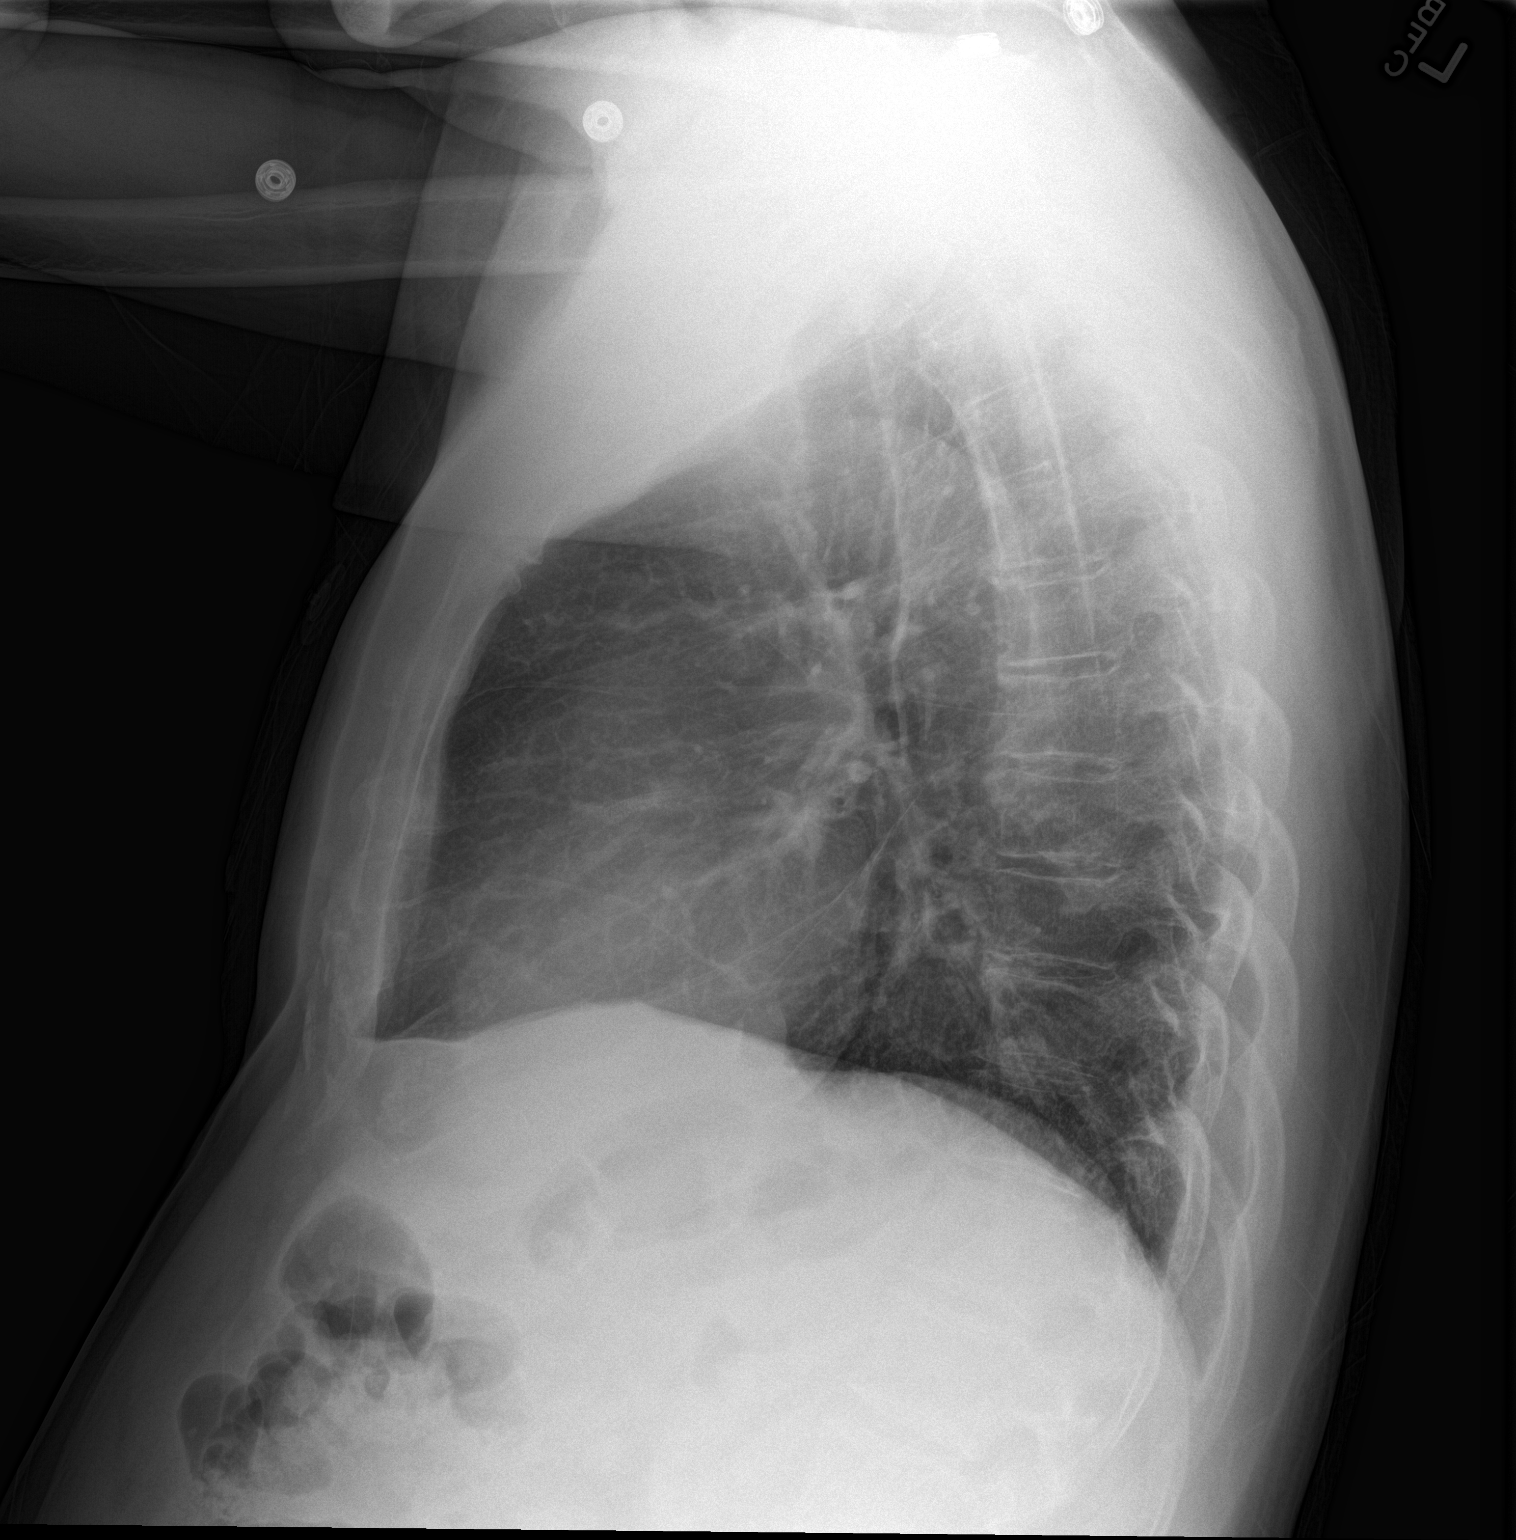

[2 of 2 positions shown; findings below may reference images not displayed]

FINDINGS: The heart size and mediastinal contours are within normal limits.
Both lungs are clear. The visualized skeletal structures are
unremarkable.
IMPRESSION: No active cardiopulmonary disease.

## 2016-10-20 NOTE — Congregational Nurse Program (Signed)
Congregational Nurse Program Note  Date of Encounter: 09/13/2016  Past Medical History: Past Medical History:  Diagnosis Date  . Coronary artery disease    s/p DES to the LAD  . HTN (hypertension)   . Hyperlipemia   . Incomplete RBBB   . Myocardial infarction 11/09/10    Encounter Details:     CNP Questionnaire - 10/20/16 1430      Patient Demographics   Is this a new or existing patient? Existing   Patient is considered a/an Immigrant   Race Latino/Hispanic     Patient Assistance   Location of Patient Assistance [employer] [place]   Patient's financial/insurance status Orange Research officer, trade unionCard/Care Connects   Uninsured Patient (Orange Card/Care Connects) Yes   Interventions Assisted patient in making appt.   Patient referred to apply for the following financial assistance Not Applicable   Transportation assistance No   Assistance securing medications No   Educational health offerings Navigating the healthcare system     Encounter Details   Primary purpose of visit Navigating the Healthcare System   Was an Emergency Department visit averted? Not Applicable   Does patient have a medical provider? Yes   Patient referred to Follow up with established PCP   Was a mental health screening completed? (GAINS tool) No   Does patient have dental issues? No   Does patient have vision issues? Yes   Was a vision referral made? Yes   Does your patient have an abnormal blood pressure today? No   Since previous encounter, have you referred patient for abnormal blood pressure that resulted in a new diagnosis or medication change? No   Does your patient have an abnormal blood glucose today? No   Since previous encounter, have you referred patient for abnormal blood glucose that resulted in a new diagnosis or medication change? No   Was there a life-saving intervention made? No     Client in with daughter to complete paperwork for screening for hearing aid.

## 2016-10-31 MED FILL — PANTOPRAZOLE SOD DR 40 MG T: 40 | 30 days supply | Qty: 30 | Fill #2

## 2016-10-31 MED FILL — ATORVASTATIN 20 MG TABLET: 20 | 30 days supply | Qty: 30 | Fill #2

## 2016-10-31 MED FILL — NITROGLYCERIN 0.4 MG TAB SL: 0.4 | 13 days supply | Qty: 25 | Fill #2

## 2016-10-31 MED FILL — METOPROLOL TARTRATE 25 MG T: 25 | 30 days supply | Qty: 60 | Fill #2

## 2016-10-31 MED FILL — LISINOPRIL 40 MG TABLET: 40 | 30 days supply | Qty: 30 | Fill #2

## 2016-11-10 ENCOUNTER — Telehealth: Payer: Self-pay | Admitting: Internal Medicine

## 2016-11-10 NOTE — Telephone Encounter (Signed)
CALLED PT, LMTCB IT IS TIME TO RENEW GCCN CARD

## 2016-11-11 ENCOUNTER — Telehealth: Payer: Self-pay | Admitting: Pharmacist

## 2016-11-11 NOTE — Progress Notes (Signed)
Contacted patient for help with medications, states all is well and has no concerns at this time. Advised patient to contact clinic if any medication concerns arise. Patient verbalized understanding.

## 2016-11-21 MED FILL — LATANOPROST 0.005% EYE DRP: 0.005 | 25 days supply | Qty: 3 | Fill #0

## 2016-11-21 MED FILL — TIMOLOL 0.5% EYE DROPS: 0.5 | 25 days supply | Qty: 5 | Fill #0

## 2016-11-23 ENCOUNTER — Other Ambulatory Visit: Payer: Self-pay | Admitting: Pharmacist

## 2016-11-23 DIAGNOSIS — I10 Essential (primary) hypertension: Secondary | ICD-10-CM

## 2016-11-25 MED ORDER — ATORVASTATIN CALCIUM 20 MG PO TABS: 20.0000 mg | ORAL_TABLET | Freq: Every day | ORAL | 5 refills | Status: DC

## 2016-11-25 MED ORDER — LISINOPRIL 40 MG PO TABS: 40.0000 mg | ORAL_TABLET | Freq: Every day | ORAL | 5 refills | Status: DC

## 2016-11-25 MED ORDER — ASPIRIN EC 81 MG PO TBEC: 81.0000 mg | DELAYED_RELEASE_TABLET | Freq: Every day | ORAL | 2 refills | Status: DC

## 2016-11-25 MED ORDER — METOPROLOL TARTRATE 25 MG PO TABS: 25.0000 mg | ORAL_TABLET | Freq: Two times a day (BID) | ORAL | 5 refills | Status: DC

## 2016-11-25 MED FILL — ATORVASTATIN 20 MG TABLET: 20 | 30 days supply | Qty: 30 | Fill #0

## 2016-11-25 MED FILL — LISINOPRIL 40 MG TABLET: 40 | 30 days supply | Qty: 30 | Fill #0

## 2016-11-25 MED FILL — METOPROLOL TARTRATE 25 MG T: 25 | 30 days supply | Qty: 60 | Fill #0

## 2016-11-30 ENCOUNTER — Ambulatory Visit: Payer: Self-pay

## 2016-12-01 ENCOUNTER — Ambulatory Visit: Payer: Self-pay

## 2016-12-02 ENCOUNTER — Ambulatory Visit: Payer: Self-pay

## 2016-12-09 MED FILL — TIMOLOL 0.5% EYE DROPS: 0.5 | 25 days supply | Qty: 5 | Fill #1

## 2016-12-19 MED FILL — LATANOPROST 0.005% EYE DRP: 0.005 | 25 days supply | Qty: 3 | Fill #1

## 2017-01-03 ENCOUNTER — Other Ambulatory Visit: Payer: Self-pay | Admitting: Internal Medicine

## 2017-01-03 DIAGNOSIS — I10 Essential (primary) hypertension: Secondary | ICD-10-CM

## 2017-01-04 ENCOUNTER — Other Ambulatory Visit: Payer: Self-pay | Admitting: Internal Medicine

## 2017-01-04 DIAGNOSIS — I10 Essential (primary) hypertension: Secondary | ICD-10-CM

## 2017-01-04 DIAGNOSIS — K219 Gastro-esophageal reflux disease without esophagitis: Secondary | ICD-10-CM

## 2017-01-04 MED FILL — METOPROLOL TARTRATE 25 MG T: 25 | 30 days supply | Qty: 60 | Fill #1

## 2017-01-04 MED FILL — ATORVASTATIN 20 MG TABLET: 20 | 30 days supply | Qty: 30 | Fill #1

## 2017-01-04 MED FILL — LISINOPRIL 40 MG TABLET: 40 | 30 days supply | Qty: 30 | Fill #1

## 2017-01-04 NOTE — Telephone Encounter (Signed)
Requested protonix and nitrostat refills from dr rivet has refills at pharm for other meds, [facility]

## 2017-01-04 NOTE — Telephone Encounter (Signed)
Needs refill  lisinopril (PRINIVIL,ZESTRIL) 40 MG tablet atorvastatin (LIPITOR) 20 MG tablet metoprolol tartrate (LOPRESSOR) 25 MG tablet [place] [facility]  Pharmacy states there are no refills

## 2017-01-05 MED ORDER — NITROGLYCERIN 0.4 MG SL SUBL: 0.4000 mg | SUBLINGUAL_TABLET | SUBLINGUAL | 3 refills | Status: DC | PRN

## 2017-01-05 MED ORDER — PANTOPRAZOLE SODIUM 40 MG PO TBEC: 40.0000 mg | DELAYED_RELEASE_TABLET | Freq: Every day | ORAL | 3 refills | Status: DC

## 2017-01-05 MED FILL — NITROGLYCERIN 0.4 MG TAB SL: 0.4 | 15 days supply | Qty: 25 | Fill #0

## 2017-01-16 MED FILL — LATANOPROST 0.005% EYE DRP: 0.005 | 25 days supply | Qty: 3 | Fill #2

## 2017-01-16 MED FILL — TIMOLOL 0.5% EYE DROPS: 0.5 | 25 days supply | Qty: 5 | Fill #2

## 2017-01-31 MED FILL — BRIMONIDINE 0.2% EYE DROP: 0.2 | 30 days supply | Qty: 10 | Fill #0

## 2017-02-01 MED FILL — METOPROLOL TARTRATE 25 MG T: 25 | 30 days supply | Qty: 60 | Fill #2

## 2017-02-01 MED FILL — ATORVASTATIN 20 MG TABLET: 20 | 30 days supply | Qty: 30 | Fill #2

## 2017-02-01 MED FILL — LISINOPRIL 40 MG TABLET: 40 | 30 days supply | Qty: 30 | Fill #2

## 2017-02-01 MED FILL — ACETAZOLAMIDE ER 500 MG CAP: 500 | 14 days supply | Qty: 28 | Fill #0

## 2017-02-03 ENCOUNTER — Other Ambulatory Visit: Payer: Self-pay | Admitting: Internal Medicine

## 2017-02-03 DIAGNOSIS — K219 Gastro-esophageal reflux disease without esophagitis: Secondary | ICD-10-CM

## 2017-02-03 NOTE — Telephone Encounter (Signed)
Please re-send rx to [facility]; with "IM program". Last one had phone in 1/25 instead of electronically. Thanks

## 2017-02-16 MED FILL — TIMOLOL 0.5% EYE DROPS: 0.5 | 25 days supply | Qty: 5 | Fill #3

## 2017-02-16 MED FILL — LATANOPROST 0.005% EYE DRP: 0.005 | 25 days supply | Qty: 3 | Fill #3

## 2017-02-28 ENCOUNTER — Other Ambulatory Visit: Payer: Self-pay | Admitting: Internal Medicine

## 2017-02-28 ENCOUNTER — Encounter: Payer: Self-pay | Admitting: Internal Medicine

## 2017-02-28 ENCOUNTER — Ambulatory Visit (INDEPENDENT_AMBULATORY_CARE_PROVIDER_SITE_OTHER): Payer: Self-pay | Admitting: Internal Medicine

## 2017-02-28 VITALS — BP 168/81 | HR 54 | Temp 98.5°F | Ht 68.0 in | Wt 193.8 lb

## 2017-02-28 DIAGNOSIS — I2510 Atherosclerotic heart disease of native coronary artery without angina pectoris: Secondary | ICD-10-CM

## 2017-02-28 DIAGNOSIS — Z8673 Personal history of transient ischemic attack (TIA), and cerebral infarction without residual deficits: Secondary | ICD-10-CM

## 2017-02-28 DIAGNOSIS — R7303 Prediabetes: Secondary | ICD-10-CM

## 2017-02-28 DIAGNOSIS — H9193 Unspecified hearing loss, bilateral: Secondary | ICD-10-CM

## 2017-02-28 DIAGNOSIS — E785 Hyperlipidemia, unspecified: Secondary | ICD-10-CM

## 2017-02-28 DIAGNOSIS — Z955 Presence of coronary angioplasty implant and graft: Secondary | ICD-10-CM

## 2017-02-28 DIAGNOSIS — I10 Essential (primary) hypertension: Secondary | ICD-10-CM

## 2017-02-28 DIAGNOSIS — I252 Old myocardial infarction: Secondary | ICD-10-CM

## 2017-02-28 DIAGNOSIS — H9192 Unspecified hearing loss, left ear: Secondary | ICD-10-CM

## 2017-02-28 DIAGNOSIS — Z79899 Other long term (current) drug therapy: Secondary | ICD-10-CM

## 2017-02-28 DIAGNOSIS — Z7982 Long term (current) use of aspirin: Secondary | ICD-10-CM

## 2017-02-28 DIAGNOSIS — Z87891 Personal history of nicotine dependence: Secondary | ICD-10-CM

## 2017-02-28 LAB — GLUCOSE, CAPILLARY: GLUCOSE-CAPILLARY: 160 mg/dL — AB (ref 65–99)

## 2017-02-28 LAB — POCT GLYCOSYLATED HEMOGLOBIN (HGB A1C): HEMOGLOBIN A1C: 6.2

## 2017-02-28 MED ORDER — METFORMIN HCL 500 MG PO TABS: 500.0000 mg | ORAL_TABLET | Freq: Two times a day (BID) | ORAL | 2 refills | Status: DC

## 2017-02-28 MED FILL — ACETAZOLAMIDE ER 500 MG CAP: 500 | 30 days supply | Qty: 60 | Fill #0

## 2017-02-28 MED FILL — metFORMIN HCL 500 MG TABS: 500 | 30 days supply | Qty: 60 | Fill #0

## 2017-02-28 NOTE — Patient Instructions (Signed)
General Instructions: - Your blood sugar level was elevated again - You need to start metformin 500 mg twice daily - Will have you follow up in 3 months to recheck - We will look into your hearing aid device   Please bring your medicines with you each time you come to clinic.  Medicines may include prescription medications, over-the-counter medications, herbal remedies, eye drops, vitamins, or other pills.   Progress Toward Treatment Goals:  Treatment Goal 02/02/2016  Hemoglobin A1C -  Blood pressure unchanged    Self Care Goals & Plans:  Self Care Goal 08/02/2016  Manage my medications take my medicines as prescribed  Eat healthy foods eat baked foods instead of fried foods; eat foods that are low in salt  Be physically active find an activity I enjoy  Meeting treatment goals -    Home Blood Glucose Monitoring 07/08/2014  Check my blood sugar no home glucose monitoring  When to check my blood sugar -     Care Management & Community Referrals:  Referral 02/24/2015  Referrals made for care management support none needed  Referrals made to community resources none

## 2017-03-01 NOTE — Assessment & Plan Note (Signed)
His A1c has stayed in the 6.0 range. Given his hx of TIA and CAD with PCI he would benefit from better glucose control. Will start him on Metformin 500 mg BID. He will follow up in 3 months to recheck A1c.

## 2017-03-01 NOTE — Assessment & Plan Note (Signed)
No anginal symptoms. Continue ASA, Lopressor, Atorvastatin, and Lisinopril. He should be on a higher intensity statin but due to lack of insurance this will be an extra burden on him as he already has difficulty affording his current medications.

## 2017-03-01 NOTE — Assessment & Plan Note (Signed)
He has significantly decreased hearing in his left ear. Will see if the audiology center can be contacted and determine why he never received his hearing aide.

## 2017-03-01 NOTE — Assessment & Plan Note (Signed)
No new neurological symptoms. Continue ASA 81 mg daily and Atorvastatin. Will work on his risk factors of HTN and prediabetes.

## 2017-03-01 NOTE — Assessment & Plan Note (Signed)
BP mildly elevated. We discussed cutting down on high salt foods. BP control has varied over the last few months. Will continue him on Lisinopril 40 mg daily and Lopressor 25 mg BID. If BP elevated at next visit would consider adding on an additional agent given his hx of TIA, CAD, and prediabetes.

## 2017-03-01 NOTE — Progress Notes (Signed)
   CC: Follow up for hypertension  HPI:  [name] is a 66 y.o. man with PMHx as noted below who presents today for follow up of his hypertension.  HTN: BP today is elevated at 152/75. He is taking Lisinopril 40 mg daily and Lopressor 25 mg BID.   Hx TIA: Denies any recent changes in speech, new onset weakness, or new tingling/numbness. He is taking aspirin 81 mg daily and atorvastatin 20 mg daily.   CAD: Hx of NSTEMI in 2011 with DES placed to the LAD. Denies any chest pain, shortness of breath, or palpitations. He is taking his ASA, Lopressor, Lisinopril, and Atorvastatin.   Prediabetes: His A1c last year was 6.3 and today has remained stable at 6.2. He denies any polyuria or polydipsia. His vision has some bluriness at baseline due to glaucoma.   Hyperlipidemia: Stable. He is taking Atorvastatin 20 mg daily. Denies any muscle cramps.  Decreased hearing: He has decreased hearing on the left side. Reports he did see a specialist at [facility] in [place]. He reports he was tested for a hearing aide about 2 months ago, but still has not received the device. His ear feels like he has "water in it" and is bothersome to him.   Past Medical History:  Diagnosis Date  . Coronary artery disease    s/p DES to the LAD  . HTN (hypertension)   . Hyperlipemia   . Incomplete RBBB   . Myocardial infarction 11/09/10    Review of Systems:   General: Denies fever, chills, night sweats, changes in weight, changes in appetite HEENT: Denies headaches, ear pain, rhinorrhea, sore throat CV: Denies orthopnea Pulm: Denies cough, wheezing GI: Denies abdominal pain, nausea, vomiting, diarrhea, constipation, melena, hematochezia GU: Denies dysuria, hematuria, frequency Msk: Denies muscle cramps, joint pains Neuro: See HPI Skin: Denies rashes, bruising Psych: Denies depression, anxiety, hallucinations  Physical Exam:  Vitals:   02/28/17 1411 02/28/17 1536  BP: (!)  152/75 (!) 168/81  Pulse: (!) 54 (!) 54  Temp: 98.5 F (36.9 C)   TempSrc: Oral   SpO2: 100%   Weight: 193 lb 12.8 oz (87.9 kg)   Height: 5\' 8"  (1.727 m)    General: Elderly man sitting up, NAD HEENT: [place]/AT, EOMI, PERRL, sclera anicteric, mucus membranes moist CV: RRR, no m/g/r Pulm: CTA bilaterally, breaths non-labored Ext: no peripheral edema, distal pulses 2+, moves all extremities Neuro: alert and oriented x 3. Strength 5/5 in upper and lower extremities. Tongue midline. Sensation symmetric and intact. Shoulder shrug intact. Gait normal.  Assessment & Plan:   See Encounters Tab for problem based charting.  Patient discussed with Dr. Cleda DaubE. Hoffman

## 2017-03-01 NOTE — Assessment & Plan Note (Addendum)
Continue Atorvastatin 20 mg daily. He should be on a higher intensity statin with his hx of CAD and TIA but this would be an extra financial burden on him.

## 2017-03-02 MED FILL — LISINOPRIL 40 MG TABLET: 40 | 30 days supply | Qty: 30 | Fill #3

## 2017-03-06 NOTE — Progress Notes (Signed)
Internal Medicine Clinic Attending  Case discussed with Dr. Rivet soon after the resident saw the patient.  We reviewed the resident's history and exam and pertinent patient test results.  I agree with the assessment, diagnosis, and plan of care documented in the resident's note.  

## 2017-03-07 MED FILL — BRIMONIDINE 0.2% EYE DROP: 0.2 | 30 days supply | Qty: 10 | Fill #1

## 2017-03-13 MED FILL — TIMOLOL 0.5% EYE DROPS: 0.5 | 25 days supply | Qty: 5 | Fill #4

## 2017-03-27 MED FILL — ACETAZOLAMIDE ER 500 MG CAP: 500 | 30 days supply | Qty: 60 | Fill #1

## 2017-03-27 MED FILL — LATANOPROST 0.005% EYE DRP: 0.005 | 25 days supply | Qty: 3 | Fill #4

## 2017-04-04 MED FILL — ATORVASTATIN 20 MG TABLET: 20 | 30 days supply | Qty: 30 | Fill #3

## 2017-04-04 MED FILL — METOPROLOL TARTRATE 25 MG T: 25 | 30 days supply | Qty: 60 | Fill #3

## 2017-04-04 MED FILL — LISINOPRIL 40 MG TABLET: 40 | 30 days supply | Qty: 30 | Fill #4

## 2017-04-06 MED FILL — BRIMONIDINE 0.2% EYE DROP: 0.2 | 30 days supply | Qty: 10 | Fill #2

## 2017-04-06 MED FILL — TIMOLOL 0.5% EYE DROPS: 0.5 | 25 days supply | Qty: 5 | Fill #5

## 2017-05-01 MED FILL — LATANOPROST 0.005% EYE DRP: 0.005 | 25 days supply | Qty: 3 | Fill #5

## 2017-05-01 MED FILL — METOPROLOL TARTRATE 25 MG T: 25 | 30 days supply | Qty: 60 | Fill #4

## 2017-05-01 MED FILL — LISINOPRIL 40 MG TAB: 40 | 30 days supply | Qty: 30 | Fill #5

## 2017-05-09 MED FILL — BRIMONIDINE 0.2% EYE DROP: 0.2 | 30 days supply | Qty: 10 | Fill #3

## 2017-05-15 MED FILL — TIMOLOL 0.5% EYE DROPS: 0.5 | 25 days supply | Qty: 5 | Fill #6

## 2017-05-17 MED FILL — ACETAZOLAMIDE ER 500 MG CAP: 500 | 30 days supply | Qty: 60 | Fill #2

## 2017-05-23 ENCOUNTER — Encounter: Payer: Self-pay | Admitting: Internal Medicine

## 2017-05-23 ENCOUNTER — Encounter: Payer: Self-pay | Admitting: *Deleted

## 2017-05-29 ENCOUNTER — Other Ambulatory Visit: Payer: Self-pay | Admitting: Internal Medicine

## 2017-05-29 DIAGNOSIS — I10 Essential (primary) hypertension: Secondary | ICD-10-CM

## 2017-05-29 MED FILL — ATORVASTATIN 20 MG TABLET: 20 | 30 days supply | Qty: 30 | Fill #4

## 2017-05-29 MED FILL — METOPROLOL TARTRATE 25 MG T: 25 | 30 days supply | Qty: 60 | Fill #5

## 2017-05-31 MED FILL — LISINOPRIL 40 MG TAB: 40 | 30 days supply | Qty: 30 | Fill #0

## 2017-05-31 MED FILL — LATANOPROST 0.005% EYE DRP: 0.005 | 25 days supply | Qty: 3 | Fill #6

## 2017-06-08 ENCOUNTER — Ambulatory Visit: Payer: Self-pay

## 2017-06-12 MED FILL — TIMOLOL 0.5% EYE DROPS: 0.5 | 25 days supply | Qty: 5 | Fill #7

## 2017-06-15 MED FILL — ACETAZOLAMIDE ER 500 MG CAP: 500 | 30 days supply | Qty: 60 | Fill #3

## 2017-06-16 MED FILL — BRIMONIDINE 0.2% EYE DROP: 0.2 | 33 days supply | Qty: 10 | Fill #0

## 2017-07-03 MED FILL — metFORMIN HCL 500 MG TABS: 500 | 30 days supply | Qty: 60 | Fill #1

## 2017-07-03 MED FILL — LISINOPRIL 40 MG TAB: 40 | 30 days supply | Qty: 30 | Fill #1

## 2017-07-03 MED FILL — ATORVASTATIN 20 MG TABLET: 20 | 30 days supply | Qty: 30 | Fill #5

## 2017-07-07 MED FILL — LATANOPROST 0.005% EYE DRP: 0.005 | 25 days supply | Qty: 3 | Fill #7

## 2017-07-11 MED FILL — BRIMONIDINE 0.2% EYE DROP: 0.2 | 33 days supply | Qty: 10 | Fill #1

## 2017-07-11 MED FILL — TIMOLOL 0.5% EYE DROPS: 0.5 | 25 days supply | Qty: 5 | Fill #8

## 2017-07-20 MED FILL — ACETAZOLAMIDE ER 500 MG CAP: 500 | 30 days supply | Qty: 60 | Fill #0

## 2017-08-02 ENCOUNTER — Ambulatory Visit (INDEPENDENT_AMBULATORY_CARE_PROVIDER_SITE_OTHER): Payer: Self-pay | Admitting: Internal Medicine

## 2017-08-02 VITALS — BP 134/74 | HR 57 | Temp 97.9°F | Ht 68.0 in | Wt 182.0 lb

## 2017-08-02 DIAGNOSIS — Z7984 Long term (current) use of oral hypoglycemic drugs: Secondary | ICD-10-CM

## 2017-08-02 DIAGNOSIS — Z87891 Personal history of nicotine dependence: Secondary | ICD-10-CM

## 2017-08-02 DIAGNOSIS — Z7982 Long term (current) use of aspirin: Secondary | ICD-10-CM

## 2017-08-02 DIAGNOSIS — K219 Gastro-esophageal reflux disease without esophagitis: Secondary | ICD-10-CM

## 2017-08-02 DIAGNOSIS — Z79899 Other long term (current) drug therapy: Secondary | ICD-10-CM

## 2017-08-02 DIAGNOSIS — E785 Hyperlipidemia, unspecified: Secondary | ICD-10-CM

## 2017-08-02 DIAGNOSIS — H9193 Unspecified hearing loss, bilateral: Secondary | ICD-10-CM

## 2017-08-02 DIAGNOSIS — R7303 Prediabetes: Secondary | ICD-10-CM

## 2017-08-02 DIAGNOSIS — H409 Unspecified glaucoma: Secondary | ICD-10-CM

## 2017-08-02 DIAGNOSIS — I2510 Atherosclerotic heart disease of native coronary artery without angina pectoris: Secondary | ICD-10-CM

## 2017-08-02 DIAGNOSIS — I10 Essential (primary) hypertension: Secondary | ICD-10-CM

## 2017-08-02 DIAGNOSIS — Z8673 Personal history of transient ischemic attack (TIA), and cerebral infarction without residual deficits: Secondary | ICD-10-CM

## 2017-08-02 LAB — POCT GLYCOSYLATED HEMOGLOBIN (HGB A1C): Hemoglobin A1C: 6.3

## 2017-08-02 LAB — GLUCOSE, CAPILLARY: Glucose-Capillary: 115 mg/dL — ABNORMAL HIGH (ref 65–99)

## 2017-08-02 MED ORDER — BRIMONIDINE TARTRATE 0.2 % OP SOLN: 1.0000 [drp] | Freq: Three times a day (TID) | OPHTHALMIC | 0 refills | Status: AC

## 2017-08-02 MED ORDER — ACETAZOLAMIDE ER 500 MG PO CP12: 500.0000 mg | ORAL_CAPSULE | Freq: Two times a day (BID) | ORAL | 0 refills | Status: DC

## 2017-08-02 MED ORDER — TIMOLOL HEMIHYDRATE 0.5 % OP SOLN: 1.0000 [drp] | Freq: Two times a day (BID) | OPHTHALMIC | 0 refills | Status: DC

## 2017-08-02 MED ORDER — FAMOTIDINE 20 MG PO TABS
20.0000 mg | ORAL_TABLET | Freq: Two times a day (BID) | ORAL | 1 refills | Status: DC | PRN
Start: 2017-08-02 — End: 2017-12-26

## 2017-08-02 MED ORDER — ATORVASTATIN CALCIUM 40 MG PO TABS: 40.0000 mg | ORAL_TABLET | Freq: Every day | ORAL | 1 refills | Status: DC

## 2017-08-02 MED ORDER — METFORMIN HCL 500 MG PO TABS: 500.0000 mg | ORAL_TABLET | Freq: Two times a day (BID) | ORAL | 3 refills | Status: DC

## 2017-08-02 MED ORDER — METOPROLOL TARTRATE 25 MG PO TABS: 25.0000 mg | ORAL_TABLET | Freq: Two times a day (BID) | ORAL | 3 refills | Status: DC

## 2017-08-02 MED ORDER — LISINOPRIL 40 MG PO TABS: ORAL_TABLET | ORAL | 3 refills | Status: DC

## 2017-08-02 MED ORDER — LATANOPROST 0.005 % OP SOLN
1.0000 [drp] | Freq: Every day | OPHTHALMIC | 0 refills | Status: DC
Start: 2017-08-02 — End: 2023-10-24

## 2017-08-02 MED ORDER — METOPROLOL TARTRATE 25 MG PO TABS
25.0000 mg | ORAL_TABLET | Freq: Two times a day (BID) | ORAL | 3 refills | Status: DC
Start: 2017-08-02 — End: 2017-08-02

## 2017-08-02 MED ORDER — FAMOTIDINE 20 MG PO TABS: 20.0000 mg | ORAL_TABLET | Freq: Two times a day (BID) | ORAL | 1 refills | Status: DC | PRN

## 2017-08-02 MED FILL — FAMOTIDINE 20 MG TABLET: 20 | 30 days supply | Qty: 60 | Fill #0

## 2017-08-02 MED FILL — metFORMIN HCL 500 MG TABS: 500 | 30 days supply | Qty: 60 | Fill #0

## 2017-08-02 MED FILL — ATORVASTATIN 40 MG TABLET: 40 | 30 days supply | Qty: 30 | Fill #0

## 2017-08-02 MED FILL — METOPROLOL TARTRATE 25 MG T: 25 | 30 days supply | Qty: 60 | Fill #0

## 2017-08-02 MED FILL — LISINOPRIL 40 MG TAB: 40 | 30 days supply | Qty: 30 | Fill #0

## 2017-08-02 NOTE — Assessment & Plan Note (Signed)
Currently on a moderate intensity statin (Lipitor 20 mg daily). Patient needs to be on a high intensity statin due to history of coronary artery disease and TIA.  Plan -Increased dose of Lipitor to 40 mg daily.

## 2017-08-02 NOTE — Assessment & Plan Note (Signed)
Currently on Lopressor, lisinopril, aspirin, and Lipitor. Denies having any anginal symptoms.  Plan -Continue current management.

## 2017-08-02 NOTE — Assessment & Plan Note (Addendum)
A1c 6.3 at this visit, unchanged from prior visit. He denies any symptoms of hyperglycemia. His medication regimen includes metformin 500 mg twice daily but patient is only taking the medication once a day. Reports watching what he eats.  Plan -Advised him to take metformin 500 mg twice daily -Recheck A1c in 6 months

## 2017-08-02 NOTE — Assessment & Plan Note (Signed)
Blood pressure 134/74 at this visit. Currently on lisinopril 40 mg daily and Lopressor 25 mg twice daily.  Plan -Medications have been refilled

## 2017-08-02 NOTE — Assessment & Plan Note (Signed)
He ran out of Protonix several months ago. Reports having GERD symptoms only occasionally.  Plan -Discontinue Protonix -Pepcid 20 mg twice daily as needed

## 2017-08-02 NOTE — Assessment & Plan Note (Addendum)
Outside meds section on epic showing several glaucoma medications prescribed to him by [facility] health care including timolol, brimonidine, latanoprost, and acetazolamide. These medications have been added to his current list.

## 2017-08-02 NOTE — Assessment & Plan Note (Addendum)
Patient was last seen by an audiologist in November 2016. States they did some tests and he has not heard back from that office.  -Advised him to call the audiology office. Our office has provided him with their phone number.

## 2017-08-02 NOTE — Progress Notes (Signed)
   CC: Patient is here to get medication refills. Prediabetes, hypertension, coronary artery disease, GERD, hyperlipidemia, glaucoma, and decreased hearing were discussed during this visit.  HPI:  [name] is a 66 y.o. male with a past medical history of conditions listed below presenting to the clinic to get medication refills. Prediabetes, hypertension, coronary artery disease, GERD, hyperlipidemia, glaucoma, and decreased hearing were discussed during this visit. Please see problem based charting for the status of the patient's current and chronic medical conditions.   Past Medical History:  Diagnosis Date  . Coronary artery disease    s/p DES to the LAD  . HTN (hypertension)   . Hyperlipemia   . Incomplete RBBB   . Myocardial infarction 11/09/10   Review of Systems: Pertinent positives mentioned in HPI. Remainder of all ROS negative.   Physical Exam:  Vitals:   08/02/17 1457  BP: 134/74  Pulse: (!) 57  Temp: 97.9 F (36.6 C)  TempSrc: Oral  SpO2: 100%  Weight: 182 lb (82.6 kg)  Height: 5\' 8"  (1.727 m)   Physical Exam  Constitutional: He is oriented to person, place, and time. He appears well-developed and well-nourished. No distress.  HENT:  Mouth/Throat: Oropharynx is clear and moist.  Eyes: Right eye exhibits no discharge. Left eye exhibits no discharge.  Cardiovascular: Normal rate, regular rhythm and intact distal pulses.   Pulmonary/Chest: Effort normal and breath sounds normal. No respiratory distress. He has no wheezes. He has no rales.  Abdominal: Soft. Bowel sounds are normal. He exhibits no distension. There is no tenderness.  Musculoskeletal: He exhibits no edema.  Neurological: He is alert and oriented to person, place, and time.  Skin: Skin is warm and dry.    Assessment & Plan:   See Encounters Tab for problem based charting.  Patient discussed with Dr. Criselda Peaches

## 2017-08-08 NOTE — Progress Notes (Signed)
Internal Medicine Clinic Attending  Case discussed with Dr. Rathoreat the time of the visit. We reviewed the resident's history and exam and pertinent patient test results. I agree with the assessment, diagnosis, and plan of care documented in the resident's note.  

## 2017-08-15 ENCOUNTER — Other Ambulatory Visit: Payer: Self-pay | Admitting: Pharmacist

## 2017-08-15 DIAGNOSIS — I2510 Atherosclerotic heart disease of native coronary artery without angina pectoris: Secondary | ICD-10-CM

## 2017-08-15 DIAGNOSIS — I10 Essential (primary) hypertension: Secondary | ICD-10-CM

## 2017-08-15 DIAGNOSIS — H409 Unspecified glaucoma: Secondary | ICD-10-CM

## 2017-08-15 DIAGNOSIS — R7303 Prediabetes: Secondary | ICD-10-CM

## 2017-08-15 DIAGNOSIS — E785 Hyperlipidemia, unspecified: Secondary | ICD-10-CM

## 2017-08-15 MED FILL — BRIMONIDINE 0.2% EYE DROP: 0.2 | 33 days supply | Qty: 10 | Fill #2

## 2017-08-15 MED FILL — LATANOPROST 0.005% EYE DRP: 0.005 | 25 days supply | Qty: 3 | Fill #8

## 2017-08-15 MED FILL — ACETAZOLAMIDE ER 500 MG CAP: 500 | 30 days supply | Qty: 60 | Fill #1

## 2017-08-15 MED FILL — TIMOLOL 0.5% EYE DROPS: 0.5 | 25 days supply | Qty: 5 | Fill #9

## 2017-08-16 MED ORDER — METOPROLOL TARTRATE 25 MG PO TABS: 25.0000 mg | ORAL_TABLET | Freq: Two times a day (BID) | ORAL | 3 refills | Status: DC

## 2017-08-16 MED ORDER — NITROGLYCERIN 0.4 MG SL SUBL: 0.4000 mg | SUBLINGUAL_TABLET | SUBLINGUAL | 3 refills | Status: DC | PRN

## 2017-08-16 MED ORDER — TIMOLOL HEMIHYDRATE 0.5 % OP SOLN: 1.0000 [drp] | Freq: Two times a day (BID) | OPHTHALMIC | 0 refills | Status: DC

## 2017-08-16 MED ORDER — METFORMIN HCL 500 MG PO TABS: 500.0000 mg | ORAL_TABLET | Freq: Two times a day (BID) | ORAL | 3 refills | Status: DC

## 2017-08-16 MED ORDER — ASPIRIN EC 81 MG PO TBEC: 81.0000 mg | DELAYED_RELEASE_TABLET | Freq: Every day | ORAL | 3 refills | Status: DC

## 2017-08-16 MED ORDER — ATORVASTATIN CALCIUM 40 MG PO TABS: 40.0000 mg | ORAL_TABLET | Freq: Every day | ORAL | 3 refills | Status: DC

## 2017-08-16 MED ORDER — LISINOPRIL 40 MG PO TABS: ORAL_TABLET | ORAL | 3 refills | Status: DC

## 2017-08-16 NOTE — Progress Notes (Signed)
No problem, thank you

## 2017-08-30 MED FILL — METOPROLOL TARTRATE 25 MG T: 25 | 30 days supply | Qty: 60 | Fill #1

## 2017-08-30 MED FILL — LISINOPRIL 40 MG TABLET: 40 | 30 days supply | Qty: 30 | Fill #1

## 2017-08-30 MED FILL — ATORVASTATIN 40 MG TABLET: 40 | 30 days supply | Qty: 30 | Fill #1

## 2017-08-30 MED FILL — metFORMIN HCL 500 MG TABS: 500 | 30 days supply | Qty: 60 | Fill #2

## 2017-08-31 MED FILL — OFLOXACIN 0.3% EYE DROPS: 0.3 | 25 days supply | Qty: 5 | Fill #0

## 2017-09-01 MED FILL — PREDNISOLONE AC 1% EYE DROP: 1 | 25 days supply | Qty: 5 | Fill #0

## 2017-09-27 MED FILL — TIMOLOL 0.5% EYE DROPS: 0.5 | 50 days supply | Qty: 5 | Fill #0

## 2017-09-27 MED FILL — LATANOPROST 0.005% EYE DRP: 0.005 | 25 days supply | Qty: 3 | Fill #0

## 2017-09-27 MED FILL — BRIMONIDINE 0.2% EYE DROP: 0.2 | 30 days supply | Qty: 10 | Fill #0

## 2017-10-03 MED FILL — PREDNISOLONE AC 1% EYE DROP: 1 | 30 days supply | Qty: 5 | Fill #0

## 2017-10-04 MED FILL — LISINOPRIL 40 MG TABLET: 40 | 30 days supply | Qty: 30 | Fill #2

## 2017-10-13 MED FILL — BRIMONIDINE 0.2% EYE DROP: 0.2 | 33 days supply | Qty: 10 | Fill #3

## 2017-10-24 ENCOUNTER — Encounter: Payer: Self-pay | Admitting: Internal Medicine

## 2017-11-01 ENCOUNTER — Ambulatory Visit: Payer: Self-pay

## 2017-11-01 MED FILL — LATANOPROST 0.005% EYE DRP: 0.005 | 25 days supply | Qty: 3 | Fill #1

## 2017-11-28 MED FILL — LATANOPROST 0.005% EYE DRP: 0.005 | 25 days supply | Qty: 3 | Fill #2

## 2017-11-29 ENCOUNTER — Other Ambulatory Visit: Payer: Self-pay | Admitting: Internal Medicine

## 2017-11-29 DIAGNOSIS — H4020X0 Unspecified primary angle-closure glaucoma, stage unspecified: Secondary | ICD-10-CM | POA: Insufficient documentation

## 2017-11-29 DIAGNOSIS — H409 Unspecified glaucoma: Secondary | ICD-10-CM

## 2017-11-29 NOTE — Telephone Encounter (Signed)
Next appt scheduled  12/26/17 with PCP. 

## 2017-12-06 MED FILL — TIMOLOL 0.5% EYE DROPS: 0.5 | 50 days supply | Qty: 5 | Fill #1

## 2017-12-14 MED FILL — PREDNISOLONE AC 1% EYE DROP: 1 | 16 days supply | Qty: 5 | Fill #0

## 2017-12-14 MED FILL — OFLOXACIN 0.3% EYE DROPS: 0.3 | 25 days supply | Qty: 5 | Fill #0

## 2017-12-26 ENCOUNTER — Other Ambulatory Visit: Payer: Self-pay

## 2017-12-26 ENCOUNTER — Ambulatory Visit (INDEPENDENT_AMBULATORY_CARE_PROVIDER_SITE_OTHER): Payer: Self-pay | Admitting: Internal Medicine

## 2017-12-26 ENCOUNTER — Ambulatory Visit (HOSPITAL_COMMUNITY)
Admission: RE | Admit: 2017-12-26 | Discharge: 2017-12-26 | Disposition: A | Discharge status: Home / Self Care | Payer: Self-pay | Source: Ambulatory Visit | Attending: Internal Medicine | Admitting: Internal Medicine

## 2017-12-26 ENCOUNTER — Encounter: Payer: Self-pay | Admitting: Internal Medicine

## 2017-12-26 VITALS — BP 157/103 | HR 55 | Temp 98.1°F | Ht 68.0 in | Wt 190.3 lb

## 2017-12-26 DIAGNOSIS — K219 Gastro-esophageal reflux disease without esophagitis: Secondary | ICD-10-CM

## 2017-12-26 DIAGNOSIS — H9193 Unspecified hearing loss, bilateral: Secondary | ICD-10-CM

## 2017-12-26 DIAGNOSIS — Z87891 Personal history of nicotine dependence: Secondary | ICD-10-CM

## 2017-12-26 DIAGNOSIS — Z79899 Other long term (current) drug therapy: Secondary | ICD-10-CM

## 2017-12-26 DIAGNOSIS — R0789 Other chest pain: Secondary | ICD-10-CM

## 2017-12-26 DIAGNOSIS — I2510 Atherosclerotic heart disease of native coronary artery without angina pectoris: Secondary | ICD-10-CM

## 2017-12-26 DIAGNOSIS — Z955 Presence of coronary angioplasty implant and graft: Secondary | ICD-10-CM

## 2017-12-26 DIAGNOSIS — Z0000 Encounter for general adult medical examination without abnormal findings: Secondary | ICD-10-CM

## 2017-12-26 DIAGNOSIS — Z7984 Long term (current) use of oral hypoglycemic drugs: Secondary | ICD-10-CM

## 2017-12-26 DIAGNOSIS — R7303 Prediabetes: Secondary | ICD-10-CM

## 2017-12-26 DIAGNOSIS — R001 Bradycardia, unspecified: Secondary | ICD-10-CM | POA: Insufficient documentation

## 2017-12-26 DIAGNOSIS — Z1211 Encounter for screening for malignant neoplasm of colon: Secondary | ICD-10-CM

## 2017-12-26 DIAGNOSIS — Z7982 Long term (current) use of aspirin: Secondary | ICD-10-CM

## 2017-12-26 DIAGNOSIS — E785 Hyperlipidemia, unspecified: Secondary | ICD-10-CM

## 2017-12-26 DIAGNOSIS — H409 Unspecified glaucoma: Secondary | ICD-10-CM

## 2017-12-26 DIAGNOSIS — Z23 Encounter for immunization: Secondary | ICD-10-CM

## 2017-12-26 DIAGNOSIS — I10 Essential (primary) hypertension: Secondary | ICD-10-CM

## 2017-12-26 LAB — GLUCOSE, CAPILLARY: GLUCOSE-CAPILLARY: 81 mg/dL (ref 65–99)

## 2017-12-26 LAB — POCT GLYCOSYLATED HEMOGLOBIN (HGB A1C): Hemoglobin A1C: 6.2

## 2017-12-26 MED ORDER — AMLODIPINE BESYLATE 5 MG PO TABS: 5.0000 mg | ORAL_TABLET | Freq: Every day | ORAL | 3 refills | Status: DC

## 2017-12-26 MED ORDER — ACETAZOLAMIDE ER 500 MG PO CP12: 500.0000 mg | ORAL_CAPSULE | Freq: Two times a day (BID) | ORAL | 0 refills | Status: DC

## 2017-12-26 MED ORDER — FAMOTIDINE 20 MG PO TABS: 20.0000 mg | ORAL_TABLET | Freq: Two times a day (BID) | ORAL | 2 refills | Status: DC | PRN

## 2017-12-26 MED FILL — PREDNISOLONE AC 1% EYE DROP: 1 | 17 days supply | Qty: 5 | Fill #0

## 2017-12-26 NOTE — Patient Instructions (Addendum)
Nice to meet you today [name].   We are going to start a new blood pressure medicine today called amlodipine.  You take this once a day.  [facility].  Hopefully having a better blood pressure will help protect your heart.  For your chest heaviness, we took a look at your heart with an EKG and everything looked good. You can try to do the medicine that dissolves under your tongue to see if it helps and we will also re-start a medicine that can help with heartburn in case that is contributing also.   We gave stool cards to do colon cancer screening  I would like to see you back in a few months to see how your blood pressure is and if your chest heaviness has gotten better.   Hearing: Office number [phone]  [facility] Number to ask about refills: [phone]

## 2017-12-26 NOTE — Progress Notes (Signed)
   CC: Follow up diabetes, BP  HPI:  [name] is a 67 y.o. M with a past medical history of CAD, HTN, HLD, DM, GERD, glaucoma who presents to the clinic for follow up of his diabetes and chronic conditions, also c/o intermittent chest discomfort.  Chest Discomfort: Pt notes intermittent feeling of chest discomfort described as a heaviness to the left side of his chest. This is not related to exertion and can happen at random times with no pattern noticed by pt. He denies associated SOB, nausea, diaphoresis, or radiation of pain. These episodes last about 5 minutes. He has a CAD history with stent placement >5 yrs ago, states the pain at that time was more severe, was exertional and associated with dyspnea. He has sl nitro but has not used these in many years.    Pre-DM: He has been in the pre-diabetic range for some time with borderline A1c values. He has been prescribed Metformin 500 mg BID but was previously only taking one dose a day, reports adherence to twice daily at present. He avoids sodas or sugary drinks, no sugar added to coffee, admits to eating breads fairly regularly.   HTN: Reports adherence to regimen with metoprolol, lisinopril.   HLD: Statin was recently increased to a high intensity dose given CAD history, tolerating well without perceived side effects.   Hearing Loss: Pt inquiring about process of acquiring hearing aid. Was previously being worked up and stated he needed to be measured for the device but appears he was lost to follow up.   Past Medical History:  Diagnosis Date  . Coronary artery disease    s/p DES to the LAD  . HTN (hypertension)   . Hyperlipemia   . Incomplete RBBB   . Myocardial infarction (HCC) 11/09/10   Review of Systems:  Review of Systems  Constitutional: Negative for diaphoresis and fever.  Respiratory: Negative for shortness of breath.   Cardiovascular:       Chest discomfort      Physical Exam:  Vitals:   12/26/17  1507  BP: (!) 165/74  Pulse: (!) 54  Temp: 98.1 F (36.7 C)  TempSrc: Oral  SpO2: 99%  Weight: 190 lb 4.8 oz (86.3 kg)  Height: 5\' 8"  (1.727 m)   General: Elderly male sitting on exam table comfortably, no acute distress HEENT: Recent ophthalmologic surgery CV: Slightly bradycardic, regular rhythm, no murmur appreciated Resp: Clear breath sounds bilaterally, normal work of breathing, no distress  Abd: Soft, +BS  Extr: No LE edema  Neuro: Alert and oriented x3 Skin: Warm, dry      Assessment & Plan:   See Encounters Tab for problem based charting.  Patient discussed with Dr. Cleda DaubE. Hoffman

## 2017-12-26 NOTE — Assessment & Plan Note (Signed)
Follow[facility] and [facility] with recent surgery for glaucoma and cataract per chart review

## 2017-12-26 NOTE — Assessment & Plan Note (Signed)
Pt again has a stable A1c on the border of diabetes range, currently on metformin 500 mg BID. Will continue metformin and again encouraged positive lifestyle modifications to diet and activity.

## 2017-12-26 NOTE — Assessment & Plan Note (Signed)
Pt had a hearing test performed in Nov 2016 which showed bilateral hearing loss and has reported on prior visits he was in the process of being fitted for hearing aids but never received them or fully completed the necessary steps. At last visit, he was advised to call the audiologist to inquire about his status. He states he was told he needed to be re-referred given the interval. Referral placed today.

## 2017-12-26 NOTE — Assessment & Plan Note (Signed)
Pt BP elevated to 165/74 on initial check, remained elevated at 157/103 on recheck after resting. He is currently on Lisinopril 40 mg, Metoprolol 25 mg BID. Would benefit from an additional agent, HR is 54 and would not increase BB dose. Will add amlodipine over HCTZ as he is on acetazolamide (though not as potent of a diuretic).   --Cont Lisinopril, Metoprolol --Add Amlodipine 5 mg, can increase at f/u if remains elevated

## 2017-12-26 NOTE — Assessment & Plan Note (Signed)
Pt reporting chest discomfort in the setting of CAD history. It is not entirely characteristic of anginal pain apart from pressure sensation. He has been adherent with his medications, though improved BP control may improve sx if there is a cardiac component of demand ischemia. His repeat EKG today was unchanged from previous with no new ST changes or Q waves (stable repolarization variant in V2). He previously was being treated with an H2 blocker for treatment of GERD which may also be a factor to his sx. Will refill, improve BP control, and monitor closely with low threshold for repeat cardiac evaluation.   --Refill famotidine 20 mg bid  --Cont ASA, metoprolol, lisinopril, lipitor. Amlodipine added

## 2017-12-26 NOTE — Assessment & Plan Note (Signed)
Refilled H2 blocker as above

## 2017-12-26 NOTE — Assessment & Plan Note (Signed)
Pt was provided with stool cards for FOBT colon cancer screening. Also administered both pna and flu vaccines. While giving together may slightly decrease immunogenicity produced, he has a history of missing or cancelling appointments and feel the benefit of ensuring he receives the vaccines outweigh the drawbacks.

## 2017-12-28 NOTE — Progress Notes (Signed)
Internal Medicine Clinic Attending  Case discussed with Dr. Harden at the time of the visit.  We reviewed the resident's history and exam and pertinent patient test results.  I agree with the assessment, diagnosis, and plan of care documented in the resident's note.  

## 2018-01-03 MED FILL — PREDNISOLONE AC 1% EYE DROP: 1 | 6 days supply | Qty: 5 | Fill #0

## 2018-01-03 MED FILL — OFLOXACIN 0.3% EYE DROPS: 0.3 | 25 days supply | Qty: 5 | Fill #0

## 2018-01-03 MED FILL — BRIMONIDINE 0.2% EYE DROP: 0.2 | 30 days supply | Qty: 10 | Fill #1

## 2018-01-03 MED FILL — LATANOPROST 0.005% EYE DRP: 0.005 | 25 days supply | Qty: 3 | Fill #3

## 2018-01-04 ENCOUNTER — Other Ambulatory Visit: Payer: Self-pay

## 2018-01-04 DIAGNOSIS — Z1211 Encounter for screening for malignant neoplasm of colon: Secondary | ICD-10-CM

## 2018-01-05 LAB — FECAL OCCULT BLOOD, IMMUNOCHEMICAL: FECAL OCCULT BLD: NEGATIVE

## 2018-01-08 ENCOUNTER — Encounter: Payer: Self-pay | Admitting: Internal Medicine

## 2018-01-10 MED FILL — PREDNISOLONE AC 1% EYE DROP: 1 | 4 days supply | Qty: 5 | Fill #0

## 2018-01-26 MED FILL — PREDNISOLONE AC 1% EYE DROP: 1 | 4 days supply | Qty: 5 | Fill #1

## 2018-02-09 MED FILL — BRIMONIDINE 0.2% EYE DROP: 0.2 | 30 days supply | Qty: 10 | Fill #2

## 2018-02-09 MED FILL — OFLOXACIN 0.3% EYE DROPS: 0.3 | 30 days supply | Qty: 5 | Fill #0

## 2018-02-27 ENCOUNTER — Other Ambulatory Visit: Payer: Self-pay | Admitting: Internal Medicine

## 2018-02-27 DIAGNOSIS — H409 Unspecified glaucoma: Secondary | ICD-10-CM

## 2018-02-27 DIAGNOSIS — I10 Essential (primary) hypertension: Secondary | ICD-10-CM

## 2018-02-28 MED FILL — TIMOLOL 0.5% EYE DROPS: 0.5 | 25 days supply | Qty: 5 | Fill #0

## 2018-03-02 MED FILL — LATANOPROST 0.005% EYE DRP: 0.005 | 25 days supply | Qty: 3 | Fill #4

## 2018-04-09 MED FILL — LATANOPROST 0.005% EYE DRP: 0.005 | 25 days supply | Qty: 3 | Fill #5

## 2018-04-09 MED FILL — BRIMONIDINE 0.2% EYE DROP: 0.2 | 30 days supply | Qty: 10 | Fill #3

## 2018-05-14 MED FILL — LATANOPROST 0.005% EYE DRP: 0.005 | 25 days supply | Qty: 3 | Fill #6

## 2018-05-21 ENCOUNTER — Other Ambulatory Visit: Payer: Self-pay | Admitting: Internal Medicine

## 2018-05-21 DIAGNOSIS — H409 Unspecified glaucoma: Secondary | ICD-10-CM

## 2018-05-22 ENCOUNTER — Ambulatory Visit: Payer: Self-pay

## 2018-06-19 MED FILL — LATANOPROST 0.005% EYE DRP: 0.005 | 25 days supply | Qty: 3 | Fill #7

## 2018-07-02 ENCOUNTER — Encounter: Payer: Self-pay | Admitting: *Deleted

## 2018-07-23 MED FILL — LATANOPROST 0.005% EYE DRP: 0.005 | 25 days supply | Qty: 3 | Fill #8

## 2018-07-27 MED FILL — OFLOXACIN 0.3% EYE DROPS: 0.3 | 25 days supply | Qty: 5 | Fill #0

## 2018-07-27 MED FILL — PREDNISOLONE AC 1% EYE DROP: 1 | 25 days supply | Qty: 5 | Fill #0

## 2018-07-27 MED FILL — BRIMONIDINE 0.2% EYE DROP: 0.2 | 66 days supply | Qty: 10 | Fill #0

## 2018-08-09 ENCOUNTER — Other Ambulatory Visit: Payer: Self-pay | Admitting: Internal Medicine

## 2018-08-09 DIAGNOSIS — E785 Hyperlipidemia, unspecified: Secondary | ICD-10-CM

## 2018-08-09 DIAGNOSIS — I2510 Atherosclerotic heart disease of native coronary artery without angina pectoris: Secondary | ICD-10-CM

## 2018-08-09 DIAGNOSIS — R7303 Prediabetes: Secondary | ICD-10-CM

## 2018-08-09 DIAGNOSIS — I10 Essential (primary) hypertension: Secondary | ICD-10-CM

## 2018-08-09 NOTE — Telephone Encounter (Signed)
Requesting refill on all meds @ [facility].

## 2018-08-21 ENCOUNTER — Ambulatory Visit: Payer: Self-pay

## 2018-08-21 ENCOUNTER — Encounter: Payer: Self-pay | Admitting: Internal Medicine

## 2018-08-27 MED FILL — LATANOPROST 0.005% EYE DRP: 0.005 | 25 days supply | Qty: 3 | Fill #9

## 2018-09-11 MED FILL — ATROPINE 1% EYE DROPS: 1 | 13 days supply | Qty: 2 | Fill #0

## 2018-09-20 MED FILL — PREDNISOLONE AC 1% EYE DROP: 1 | 4 days supply | Qty: 5 | Fill #2

## 2018-09-24 MED FILL — ATROPINE 1% EYE DROPS: 1 | 30 days supply | Qty: 5 | Fill #1

## 2018-10-22 MED FILL — LATANOPROST 0.005% EYE DRP: 0.005 | 30 days supply | Qty: 3 | Fill #0

## 2018-11-07 MED FILL — PREDNISOLONE AC 1% EYE DROP: 1 | 25 days supply | Qty: 5 | Fill #0

## 2018-11-26 ENCOUNTER — Other Ambulatory Visit: Payer: Self-pay | Admitting: *Deleted

## 2018-11-27 ENCOUNTER — Other Ambulatory Visit: Payer: Self-pay

## 2018-11-27 DIAGNOSIS — I10 Essential (primary) hypertension: Secondary | ICD-10-CM

## 2018-11-27 MED ORDER — AMLODIPINE BESYLATE 5 MG PO TABS: 5.0000 mg | ORAL_TABLET | Freq: Every day | ORAL | 3 refills | Status: DC

## 2018-11-27 NOTE — Telephone Encounter (Signed)
amLODipine (NORVASC) 5 MG tablet   REFILL REQUEST @ [facility] [place], [place] - [id number]

## 2018-11-28 ENCOUNTER — Ambulatory Visit: Payer: Self-pay

## 2019-01-01 MED FILL — LATANOPROST 0.005% EYE DRP: 0.005 | 30 days supply | Qty: 3 | Fill #1

## 2019-02-26 MED FILL — LATANOPROST 0.005% EYE DRP: 0.005 | 30 days supply | Qty: 3 | Fill #2

## 2019-05-07 MED FILL — LATANOPROST 0.005% EYE DRP: 0.005 | 30 days supply | Qty: 3 | Fill #3

## 2019-05-15 ENCOUNTER — Other Ambulatory Visit: Payer: Self-pay

## 2019-05-15 ENCOUNTER — Ambulatory Visit: Payer: Self-pay

## 2019-06-04 ENCOUNTER — Ambulatory Visit: Payer: Self-pay | Admitting: Internal Medicine

## 2019-06-04 ENCOUNTER — Other Ambulatory Visit: Payer: Self-pay

## 2019-06-04 ENCOUNTER — Encounter: Payer: Self-pay | Admitting: Internal Medicine

## 2019-06-04 VITALS — BP 166/86 | HR 64 | Temp 99.1°F | Ht 66.0 in | Wt 187.5 lb

## 2019-06-04 DIAGNOSIS — Z0000 Encounter for general adult medical examination without abnormal findings: Secondary | ICD-10-CM

## 2019-06-04 DIAGNOSIS — K219 Gastro-esophageal reflux disease without esophagitis: Secondary | ICD-10-CM

## 2019-06-04 DIAGNOSIS — Z7984 Long term (current) use of oral hypoglycemic drugs: Secondary | ICD-10-CM

## 2019-06-04 DIAGNOSIS — Z79899 Other long term (current) drug therapy: Secondary | ICD-10-CM

## 2019-06-04 DIAGNOSIS — I10 Essential (primary) hypertension: Secondary | ICD-10-CM

## 2019-06-04 DIAGNOSIS — R7303 Prediabetes: Secondary | ICD-10-CM

## 2019-06-04 DIAGNOSIS — E785 Hyperlipidemia, unspecified: Secondary | ICD-10-CM

## 2019-06-04 DIAGNOSIS — I252 Old myocardial infarction: Secondary | ICD-10-CM

## 2019-06-04 DIAGNOSIS — Z1211 Encounter for screening for malignant neoplasm of colon: Secondary | ICD-10-CM

## 2019-06-04 DIAGNOSIS — Z955 Presence of coronary angioplasty implant and graft: Secondary | ICD-10-CM

## 2019-06-04 DIAGNOSIS — Z7982 Long term (current) use of aspirin: Secondary | ICD-10-CM

## 2019-06-04 DIAGNOSIS — I2510 Atherosclerotic heart disease of native coronary artery without angina pectoris: Secondary | ICD-10-CM

## 2019-06-04 DIAGNOSIS — Z8673 Personal history of transient ischemic attack (TIA), and cerebral infarction without residual deficits: Secondary | ICD-10-CM

## 2019-06-04 LAB — POCT GLYCOSYLATED HEMOGLOBIN (HGB A1C): Hemoglobin A1C: 6.3 % — AB (ref 4.0–5.6)

## 2019-06-04 LAB — GLUCOSE, CAPILLARY: Glucose-Capillary: 154 mg/dL — ABNORMAL HIGH (ref 70–99)

## 2019-06-04 MED ORDER — METFORMIN HCL 500 MG PO TABS: 500.0000 mg | ORAL_TABLET | Freq: Two times a day (BID) | ORAL | 3 refills | Status: DC

## 2019-06-04 MED ORDER — METOPROLOL TARTRATE 25 MG PO TABS: 25.0000 mg | ORAL_TABLET | Freq: Two times a day (BID) | ORAL | 3 refills | Status: DC

## 2019-06-04 MED ORDER — ASPIRIN 81 MG PO TBEC: 81.0000 mg | DELAYED_RELEASE_TABLET | Freq: Every day | ORAL | 3 refills | Status: DC

## 2019-06-04 MED ORDER — ATORVASTATIN CALCIUM 40 MG PO TABS: 40.0000 mg | ORAL_TABLET | Freq: Every day | ORAL | 3 refills | Status: DC

## 2019-06-04 MED ORDER — FAMOTIDINE 20 MG PO TABS: 20.0000 mg | ORAL_TABLET | Freq: Two times a day (BID) | ORAL | 2 refills | Status: DC | PRN

## 2019-06-04 MED ORDER — AMLODIPINE BESYLATE 10 MG PO TABS: 10.0000 mg | ORAL_TABLET | Freq: Every day | ORAL | 3 refills | Status: DC

## 2019-06-04 MED ORDER — LISINOPRIL 40 MG PO TABS: ORAL_TABLET | ORAL | 3 refills | Status: DC

## 2019-06-04 NOTE — Assessment & Plan Note (Signed)
BP elevated today to 161/86 on recheck. He states he has been taking all his medications appropriately. Will increased Amlodipine and have patient follow up with PCP in 3 months. - Lisinopril 40mg  Daily - Metoprolol 25mg  BID - Amlodipine 10mg  Daily - Follow up in 3 months - CMP today

## 2019-06-04 NOTE — Patient Instructions (Addendum)
Thank you for allowing [place] to care for you  For your high blood pressure - BP elevated today at 161/86 - Continue Metoprolol and Lisinopril - Amlodipine increased to 10mg  Daily  For your Pre-Diabetes - A1c today was stable at 6.3 - Continue metformin  Medications refilled today  Lab work today, you will be contacted with the results  Please follow up with PCP in about 3 months

## 2019-06-04 NOTE — Assessment & Plan Note (Signed)
A1c remains stable today at 6.3 on Metformin 500mg  BID. Will continue this for prediabetes. - Metformin 500mg  BID (refilled)

## 2019-06-04 NOTE — Progress Notes (Signed)
   CC: Hypertension, Prediabetes   HPI:  [name] is a 68 y.o. M with PMHx listed below presenting for Hypertension, Prediabetes. Please see the A&P for the status of the patient's chronic medical problems.   Past Medical History:  Diagnosis Date  . Coronary artery disease    s/p DES to the LAD  . HTN (hypertension)   . Hyperlipemia   . Incomplete RBBB   . Myocardial infarction ([place]) 11/09/10   Review of Systems:  Performed and all others negative.  Physical Exam:  Vitals:   06/04/19 1527  BP: (!) 166/86  Pulse: 64  Temp: 99.1 F (37.3 C)  TempSrc: Oral  SpO2: 99%  Weight: 187 lb 8 oz (85 kg)  Height: 5\' 6"  (1.676 m)   Physical Exam Constitutional:      General: He is not in acute distress.    Appearance: Normal appearance.  Cardiovascular:     Rate and Rhythm: Normal rate and regular rhythm.     Pulses: Normal pulses.     Heart sounds: Normal heart sounds.  Pulmonary:     Effort: Pulmonary effort is normal. No respiratory distress.     Breath sounds: Normal breath sounds.  Abdominal:     General: Bowel sounds are normal. There is no distension.     Palpations: Abdomen is soft.     Tenderness: There is no abdominal tenderness.  Musculoskeletal:        General: No swelling or deformity.  Skin:    General: Skin is warm and dry.  Neurological:     General: No focal deficit present.     Mental Status: Mental status is at baseline.    Assessment & Plan:   See Encounters Tab for problem based charting.  Patient discussed with Dr. Angelia Mould

## 2019-06-04 NOTE — Assessment & Plan Note (Signed)
Patient's symptoms remain well controlled with as needed famotidine. Refill provided. - Famotidine 20mg  BID PRN

## 2019-06-04 NOTE — Assessment & Plan Note (Signed)
Patient has history of CAD with NSTEMI and PCI w DES to LAD in 2012. He denies chest pain or SOB since his last visit. Will continue current regimen. - Refills provided for ASA, Metoprolol 25mg  BID, Lisinopril 40 Daily, and Atorvastatin

## 2019-06-04 NOTE — Assessment & Plan Note (Signed)
Patient has history of CAD and TIA. Refill of statin provided. - Atorvastatin 40mg  Daily

## 2019-06-04 NOTE — Assessment & Plan Note (Signed)
Patient is due for screening labs and colon cancer screening - CMP - CBC - FOBT card for cancer screening

## 2019-06-05 ENCOUNTER — Encounter: Payer: Self-pay | Admitting: Internal Medicine

## 2019-06-05 LAB — CBC WITH DIFFERENTIAL/PLATELET
Basophils Absolute: 0 10*3/uL (ref 0.0–0.2)
Basos: 1 %
EOS (ABSOLUTE): 0.2 10*3/uL (ref 0.0–0.4)
Eos: 3 %
Hematocrit: 39.9 % (ref 37.5–51.0)
Hemoglobin: 13.4 g/dL (ref 13.0–17.7)
Immature Grans (Abs): 0 10*3/uL (ref 0.0–0.1)
Immature Granulocytes: 0 %
Lymphocytes Absolute: 1.9 10*3/uL (ref 0.7–3.1)
Lymphs: 26 %
MCH: 30.7 pg (ref 26.6–33.0)
MCHC: 33.6 g/dL (ref 31.5–35.7)
MCV: 91 fL (ref 79–97)
Monocytes Absolute: 0.8 10*3/uL (ref 0.1–0.9)
Monocytes: 10 %
Neutrophils Absolute: 4.5 10*3/uL (ref 1.4–7.0)
Neutrophils: 60 %
Platelets: 213 10*3/uL (ref 150–450)
RBC: 4.37 x10E6/uL (ref 4.14–5.80)
RDW: 13.3 % (ref 11.6–15.4)
WBC: 7.4 10*3/uL (ref 3.4–10.8)

## 2019-06-05 LAB — CMP14 + ANION GAP
ALT: 17 IU/L (ref 0–44)
AST: 19 IU/L (ref 0–40)
Albumin/Globulin Ratio: 1.8 (ref 1.2–2.2)
Albumin: 4.4 g/dL (ref 3.8–4.8)
Alkaline Phosphatase: 69 IU/L (ref 39–117)
Anion Gap: 13 mmol/L (ref 10.0–18.0)
BUN/Creatinine Ratio: 18 (ref 10–24)
BUN: 18 mg/dL (ref 8–27)
Bilirubin Total: 0.2 mg/dL (ref 0.0–1.2)
CO2: 22 mmol/L (ref 20–29)
Calcium: 8.9 mg/dL (ref 8.6–10.2)
Chloride: 107 mmol/L — ABNORMAL HIGH (ref 96–106)
Creatinine, Ser: 0.99 mg/dL (ref 0.76–1.27)
GFR calc Af Amer: 89 mL/min/{1.73_m2} (ref >59)
GFR calc non Af Amer: 77 mL/min/{1.73_m2} (ref >59)
Globulin, Total: 2.5 g/dL (ref 1.5–4.5)
Glucose: 163 mg/dL — ABNORMAL HIGH (ref 65–99)
Potassium: 4.6 mmol/L (ref 3.5–5.2)
Sodium: 142 mmol/L (ref 134–144)
Total Protein: 6.9 g/dL (ref 6.0–8.5)

## 2019-06-05 NOTE — Progress Notes (Signed)
Internal Medicine Clinic Attending  Case discussed with Dr. Melvin  at the time of the visit.  We reviewed the resident's history and exam and pertinent patient test results.  I agree with the assessment, diagnosis, and plan of care documented in the resident's note.  

## 2019-06-05 NOTE — Progress Notes (Signed)
Patient sent a letter informing him of his normal lab results for CMP and CBC, A1c previously discussed at visit.

## 2019-06-19 ENCOUNTER — Other Ambulatory Visit: Payer: Self-pay

## 2019-06-19 DIAGNOSIS — Z0000 Encounter for general adult medical examination without abnormal findings: Secondary | ICD-10-CM

## 2019-06-19 DIAGNOSIS — Z1211 Encounter for screening for malignant neoplasm of colon: Secondary | ICD-10-CM

## 2019-06-20 LAB — FECAL OCCULT BLOOD, IMMUNOCHEMICAL: Fecal Occult Bld: NEGATIVE

## 2019-06-27 ENCOUNTER — Encounter: Payer: Self-pay | Admitting: Internal Medicine

## 2019-06-27 NOTE — Progress Notes (Signed)
Patient sent a letter informing him of his negative FOBT.

## 2019-07-09 MED FILL — LATANOPROST 0.005% EYE DRP: 0.005 | 30 days supply | Qty: 3 | Fill #4

## 2019-08-26 MED FILL — DORZOLAMIDE-TIMOLOL EYE DRP: 22.3-6.8 | 90 days supply | Qty: 10 | Fill #0

## 2019-09-12 MED FILL — LATANOPROST 0.005% EYE DRP: 0.005 | 50 days supply | Qty: 3 | Fill #0

## 2019-10-14 ENCOUNTER — Ambulatory Visit (INDEPENDENT_AMBULATORY_CARE_PROVIDER_SITE_OTHER): Payer: Self-pay | Admitting: Internal Medicine

## 2019-10-14 ENCOUNTER — Encounter: Payer: Self-pay | Admitting: Internal Medicine

## 2019-10-14 ENCOUNTER — Telehealth: Payer: Self-pay | Admitting: *Deleted

## 2019-10-14 VITALS — BP 137/78 | HR 65 | Temp 98.3°F | Ht 66.5 in | Wt 189.7 lb

## 2019-10-14 DIAGNOSIS — M7989 Other specified soft tissue disorders: Secondary | ICD-10-CM

## 2019-10-14 DIAGNOSIS — R600 Localized edema: Secondary | ICD-10-CM | POA: Insufficient documentation

## 2019-10-14 DIAGNOSIS — I2510 Atherosclerotic heart disease of native coronary artery without angina pectoris: Secondary | ICD-10-CM

## 2019-10-14 DIAGNOSIS — Z955 Presence of coronary angioplasty implant and graft: Secondary | ICD-10-CM

## 2019-10-14 DIAGNOSIS — E785 Hyperlipidemia, unspecified: Secondary | ICD-10-CM

## 2019-10-14 DIAGNOSIS — I10 Essential (primary) hypertension: Secondary | ICD-10-CM

## 2019-10-14 DIAGNOSIS — R7303 Prediabetes: Secondary | ICD-10-CM

## 2019-10-14 NOTE — Telephone Encounter (Signed)
Pt's granddaughter calls and states pt's lower R leg is swollen, red, painful for 2 weeks, hurts to walk but he is at work. Offered appt today and at first she refused stating he had to work. Stressed this could be something VERY SERIOUS, finally she states they will bring him today at [address] [facility]

## 2019-10-14 NOTE — Assessment & Plan Note (Signed)
Presents with progressive bilateral leg swellings that started 2 weeks ago. Denies any chest pain, palpitations, tenderness. Associated with longer hours at work. Denies any recent travel. On exam, bilateral pitting edema but significantly larger leg diameter on right. Differential includes DVT vs varicose veins. Wells score of 3 with entire leg swelling and unilateral degree. Will obtain US to r/o DVT.  - Lower extremity venous ultrasound

## 2019-10-14 NOTE — Telephone Encounter (Signed)
Thank you. I agree 

## 2019-10-14 NOTE — Progress Notes (Signed)
   CC: Leg Swelling  HPI: [name] is a 68 y.o. M w/ PMH of CAD s/p DES, HTN, HLD, and pre-diabetes who presents with complaints of leg swelling. He mentions that his symptoms started about 2 weeks ago and has noticed progressively worsening leg edema and 'heaviness' while working with [employer]. He mentions that recently he increased his hours at work and believes this may have been associated with his symptoms. He denies any significant chest pain, palpitations, dyspnea or orthopnea. He denies any tobacco use, recent surgery, long car/airplane rides. He states when he goes to bed and elevates his legs, his symptoms improves. Denies any fevers or chills.  Past Medical History:  Diagnosis Date  . Coronary artery disease    s/p DES to the LAD  . HTN (hypertension)   . Hyperlipemia   . Incomplete RBBB   . Myocardial infarction ([place]) 11/09/10    Review of Systems: Review of Systems  Constitutional: Negative for chills, fever and malaise/fatigue.  Eyes: Negative for blurred vision.  Respiratory: Negative for cough and shortness of breath.   Cardiovascular: Positive for leg swelling. Negative for chest pain and palpitations.  Gastrointestinal: Negative for constipation, diarrhea, nausea and vomiting.  Genitourinary: Negative for urgency.  Musculoskeletal: Negative for falls and joint pain.  Neurological: Negative for dizziness.     Physical Exam: Vitals:   10/14/19 1541  BP: 137/78  Pulse: 65  Temp: 98.3 F (36.8 C)  TempSrc: Oral  SpO2: 98%  Weight: 189 lb 11.2 oz (86 kg)  Height: 5' 6.5" (1.689 m)    Physical Exam  Constitutional: He is oriented to person, place, and time. He appears well-developed and well-nourished. No distress.  HENT:  Mouth/Throat: Oropharynx is clear and moist.  Eyes: Conjunctivae are normal.  Neck: Normal range of motion. Neck supple. No JVD present.  Cardiovascular: Normal rate, regular rhythm, normal heart  sounds and intact distal pulses.  No murmur heard. Respiratory: Effort normal. He has no wheezes. He has no rales.  Musculoskeletal: Normal range of motion.        General: Edema (Bilateral 2+ pitting edema up to knees. R > L. No calf tenderness. Superficial veins present.) present.  Neurological: He is alert and oriented to person, place, and time.  Skin: Skin is warm and dry.  Multiple superficial wounds on anterior shin    Assessment & Plan:   Symptom of leg swelling Presents with progressive bilateral leg swellings that started 2 weeks ago. Denies any chest pain, palpitations, tenderness. Associated with longer hours at work. Denies any recent travel. On exam, bilateral pitting edema but significantly larger leg diameter on right. Differential includes DVT vs varicose veins. Wells score of 3 with entire leg swelling and unilateral degree. Will obtain US to r/o DVT.  - Lower extremity venous ultrasound     Patient discussed with Dr. Dareen Piano   -Gilberto Better, PGY2 [facility] Pager: [phone]

## 2019-10-14 NOTE — Patient Instructions (Addendum)
Dear [name],  Thank you for allowing [place] to provide your care today. Today we discussed his swollen leg    I have ordered ultrasound of your legs for you. I will call if any are abnormal.    Please continue to take all of your home medications as prescribed  Should you have any questions or concerns please call the internal medicine clinic at [phone].    Thank you for choosing [facility].   Venas varicosas Varicose Veins Las venas varicosas son venas que se han agrandado y [place], y se han tornado sinuosas. Suelen aparecer en las piernas. Cules son las causas? La causa de esta afeccin es el dao en las vlvulas de la vena. Estas vlvulas ayudan a que la sangre regrese al [employer]. Cuando estn daadas y dejan de funcionar correctamente, la sangre puede ir en direccin inversa y Writer a las venas cerca de la piel, lo que hace que las venas se agranden y se vean sinuosas. La afeccin puede surgir por cualquier factor que haga que la sangre retorne, como un [place], una actividad que obligue a estar de pie de forma prolongada o la obesidad. Qu incrementa el riesgo? Es ms probable que Orthoptist en las personas con estas caractersticas:  Paramedic de Academic librarian.  Estn embarazadas.  Tienen sobrepeso. Cules son los signos o los sntomas? Los sntomas de esta afeccin incluyen:  Venas abultadas, sinuosas y [place].  Sensacin de pesadez. Estos sntomas pueden empeorar hacia el final del da.  Dolor en las piernas. Estos sntomas pueden empeorar hacia el final del da.  Hinchazn en la pierna.  Cambios en el color de la piel que est [employer]. Cmo se diagnostica? Esta afeccin se puede diagnosticar en funcin de los sntomas, un examen fsico y [place]. Cmo se trata? El tratamiento de esta afeccin puede incluir lo siguiente:  Clinical cytogeneticist sentado o de pie en la misma posicin durante mucho  tiempo.  Usar medias de compresin. Estas medias ayudan a Mining engineer formacin de cogulos de [place] y a reducir la hinchazn de las piernas.  Levantar (elevar) las piernas al descansar.  Bajar de [place].  Hacer actividad fsica con regularidad. Si tiene sntomas persistentes o desea mejorar el aspecto de las venas varicosas, puede optar por un procedimiento para anular dichas venas o extraerlas. Entre los tratamientos para anular las venas, se incluyen los siguientes:  Public affairs consultant. En este tratamiento, se inyecta una solucin en la vena para anularla.  Tratamiento con lser. En este tratamiento, la vena se calienta con un lser para anularla.  Ablacin venosa por radiofrecuencia. En este tratamiento, se [place] una corriente elctrica que se produce mediante ondas de radio para anular la vena. Entre los tratamientos para extraer las venas, se incluyen los siguientes:  Flebectoma. En [employer], las venas se extraen a travs de pequeas incisiones que se realizan por encima de las venas.  Ligadura venosa y [place]. En este tratamiento, se realizan incisiones por encima de las venas. Luego, las venas se extraen despus de atarse (ligarse) con puntos (suturas). Siga estas instrucciones en su casa: Actividad  Camine todo lo que pueda. Caminar aumenta el flujo sanguneo. Esto ayuda a que la sangre regrese al corazn y Engineer, production la presin en las venas. Tambin aumenta la fuerza cardiovascular.  Siga las instrucciones del mdico con respecto al ejercicio.  No permanezca de pie o sentado en una misma posicin [employer].  No se siente con las  piernas cruzadas.  Descanse con las piernas Conservator, museum/gallery. Instrucciones generales   Siga las instrucciones del mdico en lo que respecta a la dieta.  Use medias de compresin como se lo haya indicado su mdico. No use otra clase de vestimenta ajustada alrededor de las piernas, la pelvis o la cintura.  De noche,  eleve las piernas a una altura superior al nivel del corazn.  Si se corta en la piel que est por encima de una vena varicosa y esta sangra: ? Recustese con la pierna levantada. ? Coloque un pao limpio sobre el corte y presione firmemente sobre l hasta que el sangrado se [place]. ? Aplique una venda (vendaje) sobre el corte. Comunquese con un mdico si:  La piel alrededor de las venas varicosas empieza a Lobbyist.  Siente dolor o tiene enrojecimiento, sensibilidad o hinchazn dura sobre la vena.  Est incmodo debido al dolor.  Se corta en la piel de encima de una vena varicosa y no deja de Geophysicist/field seismologist. Resumen  Las venas varicosas son venas que se han agrandado y [place], y se han tornado sinuosas. Suelen aparecer en las piernas.  La causa de esta afeccin es el dao en las vlvulas de la vena. Estas vlvulas ayudan a que la sangre regrese al [employer].  El tratamiento de esta afeccin incluye hacer movimientos frecuentes, usar medias de compresin, perder peso y Radio producer ejercicios de forma regular. En algunos casos, se realizan procedimientos que anulan o extraen las venas.  El tratamiento de esta afeccin puede incluir usar medias de compresin, [employer] piernas, perder peso y Education officer, environmental actividades de forma regular. En algunos casos, se realizan procedimientos que anulan o extraen las venas. Esta informacin no tiene Theme park manager el consejo del mdico. Asegrese de hacerle al mdico cualquier pregunta que tenga. Document Released: 09/07/2005 Document Revised: 02/20/2019 Document Reviewed: 02/20/2019 Elsevier Patient Education  2020 [employer].

## 2019-10-15 ENCOUNTER — Other Ambulatory Visit: Payer: Self-pay

## 2019-10-15 ENCOUNTER — Telehealth: Payer: Self-pay | Admitting: Internal Medicine

## 2019-10-15 ENCOUNTER — Ambulatory Visit (HOSPITAL_COMMUNITY)
Admission: RE | Admit: 2019-10-15 | Discharge: 2019-10-15 | Disposition: A | Discharge status: Home / Self Care | Payer: Self-pay | Source: Ambulatory Visit | Attending: Internal Medicine | Admitting: Internal Medicine

## 2019-10-15 DIAGNOSIS — M7989 Other specified soft tissue disorders: Secondary | ICD-10-CM | POA: Insufficient documentation

## 2019-10-15 NOTE — Progress Notes (Signed)
BLE venous duplex       has been completed. Preliminary results can be found under CV proc through chart review. June Leap, BS, RDMS, RVT   Paged Dr. Philipp Ovens at 11:27 am with negative results.

## 2019-10-15 NOTE — Telephone Encounter (Signed)
Called number on phone. Patient's daughter picked up. Informed her of preliminary ultrasound results with no evidence of blood clots. Recommended to start compression stockings for leg swelling.

## 2019-10-15 NOTE — Progress Notes (Signed)
Internal Medicine Clinic Attending ° °Case discussed with Dr. Lee at the time of the visit.  We reviewed the resident’s history and exam and pertinent patient test results.  I agree with the assessment, diagnosis, and plan of care documented in the resident’s note.  °

## 2019-10-21 MED FILL — LATANOPROST 0.005% EYE DRP: 0.005 | 50 days supply | Qty: 3 | Fill #1

## 2019-11-19 ENCOUNTER — Encounter: Payer: Self-pay | Admitting: Internal Medicine

## 2019-11-27 ENCOUNTER — Ambulatory Visit: Payer: Self-pay

## 2019-12-03 ENCOUNTER — Ambulatory Visit: Payer: Self-pay | Admitting: Internal Medicine

## 2019-12-03 ENCOUNTER — Other Ambulatory Visit: Payer: Self-pay

## 2019-12-03 VITALS — BP 154/78 | HR 68 | Temp 98.0°F | Wt 191.5 lb

## 2019-12-03 DIAGNOSIS — R600 Localized edema: Secondary | ICD-10-CM

## 2019-12-03 NOTE — Progress Notes (Signed)
   CC: Lower extremity edema  HPI:  [name] is a 68 y.o. year-old male with PMH listed below who presents to clinic for bilateral lower extremity edema. Please see problem based assessment and plan for further details.   Past Medical History:  Diagnosis Date  . Coronary artery disease    s/p DES to the LAD  . HTN (hypertension)   . Hyperlipemia   . Incomplete RBBB   . Myocardial infarction ([place]) 11/09/10   Review of Systems:   Review of Systems  Constitutional: Negative for chills, fever and malaise/fatigue.  Respiratory: Negative for cough and shortness of breath.   Cardiovascular: Positive for leg swelling. Negative for chest pain, orthopnea and PND.    Physical Exam:  Vitals:   12/03/19 1557  BP: (!) 154/78  Pulse: 68  Temp: 98 F (36.7 C)  TempSrc: Oral  SpO2: 99%  Weight: 191 lb 8 oz (86.9 kg)    General: Well-appearing male, appears younger than stated age, in no acute distress Cardiac: regular rate and rhythm, nl S1/S2, no murmurs, rubs or gallops, no JVD  Pulm: CTAB, no wheezes or crackles, no increased work of breathing on room air  Ext: warm and well perfused, 1-2+ pitting edema bilaterally, 2+ DP pulses bilaterally  Derm: skin discoloration on bilateral shin consistent with venous insufficiency    Assessment & Plan:   See Encounters Tab for problem based charting.  Patient discussed with Dr. Evette Wells

## 2019-12-03 NOTE — Patient Instructions (Addendum)
Phillip Wells,   La hinchazon de sus piernas se debe a insuficiencia venosa. Esto es [employer] en personas que pasan mucho tiempo de pie. Lo mejor que puede hacer es matener los pies elevados y usar medias de compression durante sus horas laborales. Dejenos saber si esto no function.   - Dra. Frederico Hamman

## 2019-12-03 NOTE — Progress Notes (Signed)
TED hose given to pt.

## 2019-12-03 NOTE — Assessment & Plan Note (Signed)
Patient presents for follow up of bilateral lower extremity edema.  He was seen 1.5 months ago for this at which time a vascular US was ordered to rule out DVT and compression stockings were recommended to help with edema. [place] was negative for DVT. Since then, he has continued to struggle with edema. He notices edema fully resolves in the morning after having his legs elevated all night. Has not tried compression stockings.  He denies chest pain, orthopnea, PND, and shortness of breath.  On exam, he has 1-2 + pitting edema bilaterally, pulses are strong bilaterally, and there is skin discoloration over bilateral shins consistent with venous insufficiency. ABIs done today, 1.25 on both LE. Recommended LE elevation. Fitted and provided compression stockings.

## 2019-12-09 NOTE — Progress Notes (Signed)
Internal Medicine Clinic Attending  Case discussed with Dr. Santos-Sanchez at the time of the visit.  We reviewed the resident's history and exam and pertinent patient test results.  I agree with the assessment, diagnosis, and plan of care documented in the resident's note.    

## 2020-01-22 MED FILL — DORZOLAMIDE-TIMOLOL EYE DRP: 22.3-6.8 | 90 days supply | Qty: 10 | Fill #1

## 2020-01-22 MED FILL — LATANOPROST 0.005% EYE DRP: 0.005 | 50 days supply | Qty: 3 | Fill #2

## 2020-03-17 ENCOUNTER — Other Ambulatory Visit (HOSPITAL_COMMUNITY): Payer: Self-pay | Admitting: Ophthalmology

## 2020-03-17 MED FILL — DORZOLAMIDE-TIMOLOL EYE DRP: 22.3-6.8 | 50 days supply | Qty: 10 | Fill #0

## 2020-03-17 MED FILL — LATANOPROST 0.005% EYE DRP: 0.005 | 50 days supply | Qty: 3 | Fill #0

## 2020-05-26 ENCOUNTER — Ambulatory Visit (INDEPENDENT_AMBULATORY_CARE_PROVIDER_SITE_OTHER): Payer: Self-pay | Admitting: Internal Medicine

## 2020-05-26 ENCOUNTER — Other Ambulatory Visit: Payer: Self-pay | Admitting: *Deleted

## 2020-05-26 ENCOUNTER — Encounter: Payer: Self-pay | Admitting: Internal Medicine

## 2020-05-26 DIAGNOSIS — I2510 Atherosclerotic heart disease of native coronary artery without angina pectoris: Secondary | ICD-10-CM

## 2020-05-26 DIAGNOSIS — Z7984 Long term (current) use of oral hypoglycemic drugs: Secondary | ICD-10-CM

## 2020-05-26 DIAGNOSIS — E785 Hyperlipidemia, unspecified: Secondary | ICD-10-CM

## 2020-05-26 DIAGNOSIS — E119 Type 2 diabetes mellitus without complications: Secondary | ICD-10-CM

## 2020-05-26 DIAGNOSIS — R7303 Prediabetes: Secondary | ICD-10-CM

## 2020-05-26 DIAGNOSIS — I10 Essential (primary) hypertension: Secondary | ICD-10-CM

## 2020-05-26 LAB — POCT GLYCOSYLATED HEMOGLOBIN (HGB A1C): Hemoglobin A1C: 6.5 % — AB (ref 4.0–5.6)

## 2020-05-26 LAB — GLUCOSE, CAPILLARY: Glucose-Capillary: 119 mg/dL — ABNORMAL HIGH (ref 70–99)

## 2020-05-26 MED ORDER — ASPIRIN 81 MG PO TBEC: 81.0000 mg | DELAYED_RELEASE_TABLET | Freq: Every day | ORAL | 3 refills | Status: DC

## 2020-05-26 MED ORDER — METOPROLOL TARTRATE 25 MG PO TABS: 25.0000 mg | ORAL_TABLET | Freq: Two times a day (BID) | ORAL | 3 refills | Status: DC

## 2020-05-26 MED ORDER — METFORMIN HCL 500 MG PO TABS: 500.0000 mg | ORAL_TABLET | Freq: Two times a day (BID) | ORAL | 3 refills | Status: DC

## 2020-05-26 MED ORDER — LISINOPRIL 40 MG PO TABS: ORAL_TABLET | ORAL | 3 refills | Status: DC

## 2020-05-26 MED ORDER — AMLODIPINE-ATORVASTATIN 10-40 MG PO TABS: 1.0000 | ORAL_TABLET | Freq: Every day | ORAL | 3 refills | Status: DC

## 2020-05-26 NOTE — Progress Notes (Signed)
CC: Hypertension  HPI: [name] is a 69 y.o. with PMH listed below presenting with complaint of hypertension. Please see problem based assessment and plan for further details.  Past Medical History:  Diagnosis Date   Coronary artery disease    s/p DES to the LAD   HTN (hypertension)    Hyperlipemia    Incomplete RBBB    Myocardial infarction ([place]) 11/09/10    Review of Systems: Review of Systems  Constitutional: Negative for chills, fever and malaise/fatigue.  Eyes: Negative for blurred vision.  Respiratory: Negative for cough and shortness of breath.   Cardiovascular: Negative for chest pain, palpitations and leg swelling.  Gastrointestinal: Negative for constipation, diarrhea, nausea and vomiting.  Genitourinary: Negative for dysuria.  Neurological: Negative for dizziness and headaches.     Physical Exam: Vitals:   05/26/20 1453  BP: 138/74  Pulse: (!) 54  Temp: 98.4 F (36.9 C)  TempSrc: Oral  SpO2: 98%  Weight: 190 lb 9.6 oz (86.5 kg)  Height: 5\' 6"  (1.676 m)    Physical Exam  Constitutional: He is oriented to person, place, and time. No distress.  HENT:  Mouth/Throat: Mucous membranes are moist. No oropharyngeal exudate. Oropharynx is clear.  Eyes: Pupils are equal, round, and reactive to light. Conjunctivae are normal.  Cardiovascular: Normal rate, regular rhythm, normal heart sounds and normal pulses.  No murmur heard. Respiratory: Effort normal and breath sounds normal. He has no wheezes. He has no rales.  GI: Soft. Normal appearance and bowel sounds are normal. He exhibits no distension. There is no abdominal tenderness.  Musculoskeletal:        General: No swelling or tenderness. Normal range of motion.     Cervical back: Normal range of motion and neck supple.  Neurological: He is alert and oriented to person, place, and time.  Skin: Skin is warm and dry.     Assessment & Plan:   Type 2 diabetes mellitus ([place]) Presents  for continued management. Previously documented hemoglobin a1c of 6.8. Currently has been on metformin monotherapy. Hgb a1c at below 6.4 ever since starting therapy. Denies any significant diarrhea or other GI side effects.  - C/w metformin 500mg  BID - Hgb a1c  Hyperlipemia Pt requires refills on medications with associated diagnosis above.  Reviewed disease process and find this medication to be necessary, will not change dose or alter current therapy.  CAD (coronary artery disease) Pt requires refills on medications with associated diagnosis above.  Reviewed disease process and find this medication to be necessary, will not change dose or alter current therapy.  Essential hypertension BP Readings from Last 3 Encounters:  05/26/20 138/74  12/03/19 (!) 154/78  10/14/19 137/78   [name] is a 69 yo M w/ PMH of cad, T2DM, HLD, HTN, Glaucoma and TIA presenting to [facility] to meet PCP. He states he has been doing well since his last visit. Previously noted to have lower extremity swelling concerning for DVTs but found to be negative on ultrasound. He mentions running out of his bp meds and requiring refills. Chart review shows prior hx of uncontrolled BP w/ increase in amlodipine dose to 10mg  daily. He mentions daily adherence but questionable based on daughter's input. Currently using [facility] due to lack of insurance coverage.   A/P Above goal bp this visit. Will try to find combination options on [facility] formulary to assist with pill burden and promote adherence - Amlodipine-atorvastatin combo pill - C/w lisinopril 40mg , metoprolol 25mg  BID -  May need to add diuretic if contineues to be hypertensive - Check bmp in anticipation for additional anti-hypertensive therapy    Patient discussed with Dr. Antony Contras   -Judeth Cornfield, PGY2 [facility] Pager: [phone]

## 2020-05-26 NOTE — Assessment & Plan Note (Addendum)
BP Readings from Last 3 Encounters:  05/26/20 138/74  12/03/19 (!) 154/78  10/14/19 137/78   [name] is a 69 yo M w/ PMH of cad, T2DM, HLD, HTN, Glaucoma and TIA presenting to [facility] to meet PCP. He states he has been doing well since his last visit. Previously noted to have lower extremity swelling concerning for DVTs but found to be negative on ultrasound. He mentions running out of his bp meds and requiring refills. Chart review shows prior hx of uncontrolled BP w/ increase in amlodipine dose to 10mg  daily. He mentions daily adherence but questionable based on daughter's input. Currently using [facility] due to lack of insurance coverage.   A/P Above goal bp this visit. Will try to find combination options on [facility] formulary to assist with pill burden and promote adherence - Amlodipine-atorvastatin combo pill - C/w lisinopril 40mg , metoprolol 25mg  BID - May need to add diuretic if contineues to be hypertensive - Check bmp in anticipation for additional anti-hypertensive therapy

## 2020-05-26 NOTE — Patient Instructions (Addendum)
Dear [name],  Thank you for allowing [place] to provide your care today. Today we discussed your blood pressure    I have ordered bmp, hemoglobin a1c labs for you. I will call if any are abnormal.    Today we made no changes to your medications:    Please follow-up in 6 months.    Should you have any questions or concerns please call [facility] at [phone].    Thank you for choosing [facility].   Plan de alimentacin DASH DASH Eating Plan DASH es la sigla en ingls de "Enfoques Alimentarios para Detener la Hipertensin" (Dietary Approaches to Stop Hypertension). El plan de alimentacin DASH ha demostrado bajar la presin arterial elevada (hipertensin). Tambin puede reducir [employer] de diabetes tipo 2, enfermedad cardaca y accidente cerebrovascular. Este plan tambin puede ayudar a Phillip Wells. Consejos para seguir este plan  Pautas generales  Evite ingerir ms de 2,300 mg (miligramos) de sal (sodio) por da. Si tiene hipertensin, es posible que necesite reducir la ingesta de sodio a 1,500 mg por da.  Limite el consumo de alcohol a no ms de por da si es mujer y no est [place], y por da si es hombre. Una medida equivale a 12oz ( ) de cerveza, 5oz ( ) de vino o 1oz (35ml) de bebidas alcohlicas de alta graduacin.  Trabaje con su mdico para mantener un peso saludable o perder Phillip Wells. Pregntele cul es el peso recomendado para usted.  Realice al menos 30 minutos de ejercicio que haga que se acelere su corazn (ejercicio Magazine features editor) la [employer] de la [place]. Estas actividades pueden incluir caminar, nadar o andar en bicicleta.  Trabaje con su mdico o especialista en alimentacin y nutricin (nutricionista) para ajustar su plan alimentario a sus necesidades calricas personales. Lectura de las etiquetas de los alimentos   Verifique en las etiquetas de los alimentos, la cantidad de sodio por  porcin. Elija alimentos con menos del 5 por ciento del valor diario de sodio. Generalmente, los alimentos con menos de 300 mg de sodio por porcin se encuadran dentro de este plan alimentario.  Para encontrar cereales integrales, busque la palabra "integral" como primera palabra en la lista de ingredientes. De compras  Compre productos en los que en su etiqueta diga: "bajo contenido de sodio" o "sin agregado de sal".  Compre alimentos frescos. Evite los alimentos enlatados y comidas precocidas o congeladas. Coccin  Evite agregar sal cuando cocine. Use hierbas o aderezos sin sal, en lugar de sal de mesa o sal marina. Consulte al mdico o farmacutico antes de usar sustitutos de la sal.  No fra los alimentos. A la hora de cocinar los alimentos opte por hornearlos, hervirlos, grillarlos y asarlos a Phillip Wells.  Cocine con aceites cardiosaludables, como oliva, canola, soja o girasol. Planificacin de las comidas  Consuma una dieta equilibrada, que incluya lo siguiente: ? 5o ms porciones de frutas y Phillip Wells. Trate de que la mitad del plato de cada comida sean frutas y verduras. ? Hasta 6 u 8 porciones de cereales integrales por da. ? Menos de 6 onzas de carne, aves o pescado Copy. Una porcin de 3 onzas de carne tiene casi el mismo tamao que un mazo de cartas. Un huevo equivale a 1 onza. ? Dos porciones de productos lcteos descremados por Futures trader. ? Una porcin de frutos secos, semillas o frijoles 5 veces por semana. ? Grasas cardiosaludables. Las grasas saludables llamadas cidos grasos omega-3  se encuentran en alimentos como semillas de lino y pescados de agua fra, como por ejemplo, sardinas, salmn y caballa.  Limite la cantidad que ingiere de los siguientes alimentos: ? Alimentos enlatados o envasados. ? Alimentos con alto contenido de grasa trans, como alimentos fritos. ? Alimentos con alto contenido de grasa saturada, como carne con grasa. ? Dulces, postres,  bebidas azucaradas y otros alimentos con azcar agregada. ? Productos lcteos enteros.  No le agregue sal a los alimentos antes de probarlos.  Trate de comer al menos 2 comidas vegetarianas por semana.  Consuma ms comida casera y menos de restaurante, de bufs y comida rpida.  Cuando coma en un restaurante, pida que preparen su comida con menos sal o, en lo posible, sin nada de sal. Qu alimentos se recomiendan? Los alimentos enumerados a continuacin no constituyen Phillip Wells. Hable con el nutricionista sobre las mejores opciones alimenticias para usted. Cereales Pan de salvado o integral. Pasta de salvado o integral. Arroz integral. Avena. Quinua. Trigo burgol. Cereales integrales y con bajo contenido de Phillip Wells. Pan pita. Galletitas de [place] con bajo contenido de [place] Phillip Wells) y [place]. Tortillas de [place] integral. Verduras Verduras frescas o congeladas (crudas, al vapor, asadas o grilladas). Jugos de tomate y verduras con bajo contenido de sodio o reducidos en sodio. Salsa y pasta de tomate con bajo contenido de sodio o reducidas en sodio. Verduras enlatadas con bajo contenido de sodio o reducidas en sodio. Frutas Todas las frutas frescas, congeladas o disecadas. Frutas enlatadas en jugo natural (sin agregado de azcar). Carne y otros alimentos proteicos Pollo o pavo sin piel. Carne de pollo o de [place]. Cerdo desgrasado. Pescado y [employer]. Claras de huevo. Porotos, guisantes o lentejas secos. Frutos secos, mantequilla de frutos secos y semillas sin sal. Frijoles enlatados sin sal. Cortes de carne vacuna magra, desgrasada. Embutidos magros, con bajo contenido de [place]. Lcteos Leche descremada (1%) o descremada. Quesos sin grasa, con bajo contenido de grasa o descremados. Queso blanco o ricota sin grasa, con bajo contenido de [place]. Yogur semidescremado o descremado. Queso con bajo contenido de [place] Phillip Wells) y [place]. Grasas y [employer] untables que no contengan grasas trans. Aceite  vegetal. Jerolyn Shin y aderezos para ensaladas livianos o con bajo contenido de grasas (reducidos en sodio). Aceite de canola, crtamo, oliva, soja y [place]. [place]. Condimentos y otros alimentos Hierbas. Especias. Mezclas de condimentos sin sal. Palomitas de maz y pretzels sin sal. Dulces con bajo contenido de grasas. Qu alimentos no se recomiendan? Los alimentos enumerados a continuacin no constituyen Phillip Wells. Hable con el nutricionista sobre las mejores opciones alimenticias para usted. Cereales Productos de panificacin hechos con grasa, como medialunas, magdalenas y algunos panes. Comidas con arroz o pasta seca listas para usar. Verduras Verduras con crema o fritas. Verduras en salsa de [place]. Verduras enlatadas regulares (que no sean con bajo contenido de sodio o reducidas en sodio). Pasta y salsa de tomates enlatadas regulares (que no sean con bajo contenido de sodio o reducidas en sodio). Jugos de tomate y verduras regulares (que no sean con bajo contenido de sodio o reducidos en sodio). Pepinillos. Aceitunas. Nils Pyle Fruta enlatada en almbar liviano o espeso. Frutas cocidas en aceite. Frutas con salsa de crema o [place]. Carne y otros alimentos proteicos Cortes de carne con grasa. Costillas. Carne frita. [place]. Salchichas. Mortadela y otras carnes procesadas. Salame. Panceta. Perros calientes (hotdogs). Salchicha de cerdo. Frutos secos y semillas con sal. Frijoles enlatados con agregado de sal. Pescado enlatado o ahumado. Huevos enteros  o yemas. Pollo o pavo con piel. Lcteos Leche entera o al 2%, crema y mitad leche y mitad crema. Queso crema entero o con toda su grasa. Yogur entero o endulzado. Quesos con toda su grasa. Sustitutos de cremas no lcteas. Coberturas batidas. Quesos para untar y quesos procesados. Grasas y [employer]. Margarina en barra. [place]. Materia grasa. Mantequilla clarificada. Grasa de panceta. Aceites tropicales como aceite de coco,  palmiste o palma. Condimentos y otros alimentos Palomitas de maz y pretzels con sal. Sal de cebolla, sal de ajo, sal condimentada, sal de mesa y sal marina. Salsa [place]. Salsa trtara. Salsa barbacoa. Salsa teriyaki. Salsa de soja, incluso la que tiene contenido reducido de [place]. Salsa de carne. Salsas en lata y envasadas. Salsa de pescado. Salsa de [place]. Salsa rosada. Rbano picante envasado. [place]. Mostaza. Saborizantes y tiernizantes para carne. Caldo en cubitos. Salsa picante y salsa tabasco. Escabeches envasados o ya preparados. Aderezos para tacos prefabricados o envasados. Salsas. Aderezos comunes para ensalada. Dnde encontrar ms informacin:  [place], los Pulmones y Herbalist (National Heart, Lung, and [place]): https://wilson-eaton.com/  Asociacin Estadounidense del Corazn (American Heart Association): www.heart.org Resumen  El plan de alimentacin DASH ha demostrado bajar la presin arterial elevada (hipertensin). Tambin puede reducir [employer] de diabetes tipo 2, enfermedad cardaca y accidente cerebrovascular.  Con el plan de alimentacin DASH, deber limitar el consumo de sal (sodio) a 2,300 mg por da. Si tiene hipertensin, es posible que necesite reducir la ingesta de sodio a 1,500 mg por da.  Cuando siga el plan de alimentacin DASH, trate de comer ms frutas frescas y verduras, cereales integrales, carnes magras, lcteos descremados y grasas cardiosaludables.  Trabaje con su mdico o especialista en alimentacin y nutricin (nutricionista) para ajustar su plan alimentario a sus necesidades calricas personales. Esta informacin no tiene Marine Wells el consejo del mdico. Asegrese de hacerle al mdico cualquier pregunta que tenga. Document Revised: 03/20/2017 Document Reviewed: 03/20/2017 Elsevier Patient Education  [place].

## 2020-05-26 NOTE — Assessment & Plan Note (Addendum)
Presents for continued management. Previously documented hemoglobin a1c of 6.8. Currently has been on metformin monotherapy. Hgb a1c at below 6.4 ever since starting therapy. Denies any significant diarrhea or other GI side effects.  - C/w metformin 500mg  BID - Hgb a1c

## 2020-05-26 NOTE — Assessment & Plan Note (Signed)
Pt requires refills on medications with associated diagnosis above.  Reviewed disease process and find this medication to be necessary, will not change dose or alter current therapy. 

## 2020-05-27 ENCOUNTER — Ambulatory Visit: Payer: Self-pay

## 2020-05-27 ENCOUNTER — Telehealth: Payer: Self-pay | Admitting: *Deleted

## 2020-05-27 DIAGNOSIS — I10 Essential (primary) hypertension: Secondary | ICD-10-CM

## 2020-05-27 DIAGNOSIS — E119 Type 2 diabetes mellitus without complications: Secondary | ICD-10-CM

## 2020-05-27 LAB — BMP8+ANION GAP
Anion Gap: 16 mmol/L (ref 10.0–18.0)
BUN/Creatinine Ratio: 27 — ABNORMAL HIGH (ref 10–24)
BUN: 23 mg/dL (ref 8–27)
CO2: 21 mmol/L (ref 20–29)
Calcium: 9 mg/dL (ref 8.6–10.2)
Chloride: 101 mmol/L (ref 96–106)
Creatinine, Ser: 0.86 mg/dL (ref 0.76–1.27)
GFR calc Af Amer: 101 mL/min/{1.73_m2} (ref >59)
GFR calc non Af Amer: 87 mL/min/{1.73_m2} (ref >59)
Glucose: 127 mg/dL — ABNORMAL HIGH (ref 65–99)
Potassium: 4.4 mmol/L (ref 3.5–5.2)
Sodium: 138 mmol/L (ref 134–144)

## 2020-05-27 NOTE — Telephone Encounter (Signed)
Hmm, it'd be great if they could update their website then... I specifically ordered it as it was listed on their formulary. I'll re-order as separate scripts. Thank you.

## 2020-05-27 NOTE — Progress Notes (Signed)
Internal Medicine Clinic Attending ° °Case discussed with Dr. Lee at the time of the visit.  We reviewed the resident’s history and exam and pertinent patient test results.  I agree with the assessment, diagnosis, and plan of care documented in the resident’s note.  °

## 2020-05-27 NOTE — Telephone Encounter (Signed)
[facility] does not have CADUET 10/40 THEY PROPOSE separate scripts for both Please advise

## 2020-05-28 NOTE — Telephone Encounter (Signed)
Sorry

## 2020-05-29 MED ORDER — ATORVASTATIN CALCIUM 40 MG PO TABS: 40.0000 mg | ORAL_TABLET | Freq: Every day | ORAL | 3 refills | Status: DC

## 2020-05-29 MED ORDER — AMLODIPINE BESYLATE 10 MG PO TABS: 10.0000 mg | ORAL_TABLET | Freq: Every day | ORAL | 3 refills | Status: DC

## 2020-06-02 MED FILL — LATANOPROST 0.005% EYE DRP: 0.005 | 50 days supply | Qty: 3 | Fill #1

## 2020-06-02 MED FILL — DORZOLAMIDE-TIMOLOL EYE DRP: 22.3-6.8 | 50 days supply | Qty: 10 | Fill #1

## 2020-09-07 MED FILL — LATANOPROST 0.005% EYE DRP: 0.005 | 50 days supply | Qty: 3 | Fill #2

## 2020-11-10 MED FILL — LATANOPROST 0.005% EYE DRP: 0.005 | 38 days supply | Qty: 3 | Fill #3

## 2020-12-16 MED FILL — LATANOPROST 0.005% EYE DRP: 0.005 | 38 days supply | Qty: 3 | Fill #4

## 2020-12-24 ENCOUNTER — Other Ambulatory Visit: Payer: Self-pay

## 2020-12-24 ENCOUNTER — Ambulatory Visit: Payer: Self-pay

## 2021-01-19 ENCOUNTER — Ambulatory Visit (INDEPENDENT_AMBULATORY_CARE_PROVIDER_SITE_OTHER): Payer: Self-pay | Admitting: Internal Medicine

## 2021-01-19 ENCOUNTER — Other Ambulatory Visit: Payer: Self-pay

## 2021-01-19 ENCOUNTER — Encounter: Payer: Self-pay | Admitting: Internal Medicine

## 2021-01-19 VITALS — BP 153/88 | HR 58 | Temp 98.2°F | Ht 66.0 in | Wt 195.6 lb

## 2021-01-19 DIAGNOSIS — H9312 Tinnitus, left ear: Secondary | ICD-10-CM

## 2021-01-19 NOTE — Assessment & Plan Note (Signed)
[name] describes a 2-year history of left ear tinnitus.  He states that around the time it started, he does not recall any new medications or changes to his medical history at the time.  He notes that he initially thought it would resolve but it never did, and that is what brings him in today.  Additionally, he is now finding that people need to speak up or he has to pay extra attention to hear what they are saying, and he feels like this may be due to the tinnitus.  He denies any pulsatile nature to the tinnitus, and no alleviating or exacerbating factors.  He denies any dizziness, headaches, ear pain, ear drainage.  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  He does occasionally wear ear protection.  Assessment/plan: Differential includes tinnitus secondary to hearing loss.  Lower on the differential is tinnitus secondary to vascular etiology, however nonpulsatile without any alleviating factors.  I discussed with [name] and his daughter at the visit that next steps are a referral to ENT for further evaluation.  They are in agreement.  -Referral to ENT

## 2021-01-19 NOTE — Progress Notes (Signed)
   CC: Tinnitus  HPI:  Phillip Wells is a 70 y.o. with a PMHx as listed below who presents to the clinic for Tinnitus.   Please see the Encounters tab for problem-based Assessment & Plan regarding status of patient's acute and chronic conditions.  Past Medical History:  Diagnosis Date  . Coronary artery disease    s/p DES to the LAD  . HTN (hypertension)   . Hyperlipemia   . Incomplete RBBB   . Myocardial infarction (HCC) 11/09/10   Review of Systems: Review of Systems  HENT: Positive for hearing loss and tinnitus. Negative for ear discharge and ear pain.   Eyes: Negative for blurred vision.  Neurological: Negative for dizziness, tingling, focal weakness, weakness and headaches.  Psychiatric/Behavioral: Negative for hallucinations.   Physical Exam:  Vitals:   01/19/21 1535 01/19/21 1545  BP: (!) 157/91 (!) 153/88  Pulse: 62 (!) 58  Temp: 98.2 F (36.8 C)   TempSrc: Oral   SpO2: 99%   Weight: 195 lb 9.6 oz (88.7 kg)   Height: 5\' 6"  (1.676 m)    Physical Exam Vitals and nursing note reviewed.  Constitutional:      General: He is not in acute distress.    Appearance: He is normal weight.  HENT:     Head: Normocephalic.     Right Ear: Ear canal and external ear normal. No drainage. There is no impacted cerumen. Tympanic membrane is not scarred, perforated, erythematous or bulging.     Left Ear: Ear canal and external ear normal. No drainage. There is no impacted cerumen. Tympanic membrane is not scarred, perforated, erythematous or bulging.     Ears:     Comments: Mild injection of the left malleus at the superior aspect Neck:     Vascular: Carotid bruit (Left, minimal) present.  Neurological:     Mental Status: He is alert.    Assessment & Plan:   See Encounters Tab for problem based charting.  Patient discussed with Dr. 

## 2021-01-19 NOTE — Patient Instructions (Addendum)
It was nice seeing you today! Thank you for choosing [facility] for your Primary Care.    Today we talked about:   1. Ringing of your left ear: The medical term for this is Tinnitus. I will send in a referral [facility], who specializes in the ear.   Please schedule an appointment with your primary care doctor as well, as you are overdue for a regular follow up.   ------------------------ Fue agradable verte hoy! Karl Pock por elegir [facility] para su Atencin Primaria.   Hoy hablamos de:  Zumbido en el odo izquierdo: el trmino mdico para esto es Tinnitus. Enviar una remisin a la [place] de [place], [place] y [place], que se especializa en el odo.  Programe tambin una cita con su mdico de atencin primaria, ya que est atrasado para un seguimiento regular.

## 2021-01-21 MED FILL — LATANOPROST 0.005% EYE DRP: 0.005 | 38 days supply | Qty: 3 | Fill #5

## 2021-01-22 NOTE — Progress Notes (Signed)
Internal Medicine Clinic Attending  I saw and evaluated the patient.  I personally confirmed the key portions of the history and exam documented by Dr. Basaraba and I reviewed pertinent patient test results.  The assessment, diagnosis, and plan were formulated together and I agree with the documentation in the resident's note.    

## 2021-04-02 ENCOUNTER — Telehealth: Payer: Self-pay | Admitting: Internal Medicine

## 2021-04-02 DIAGNOSIS — H9312 Tinnitus, left ear: Secondary | ICD-10-CM

## 2021-04-02 NOTE — Telephone Encounter (Signed)
Pls advise if a referral can be placed per the message below:  Powers, Delsa Sale, RN  Verdene Lennert, MD Cc: Shon Hough F Hello, Dr. Huel Cote,  Bc pt is uninsured, the only ENT available thru the [facility] charity care program is Dr. Narda Bonds. Dr. Ezzard Standing states for this dx, pt will need audiology test first and his office does not do hearing tests.  Can you please put in an order for audiology testing here at [facility] and a new referral for Dr. Ezzard Standing (his office closed the original due to complications with lack of insurance we have now worked out with the [employer] care program)?  I am copying Chilon so she can try to expedite the audiology referral since the pt has already had to wait this long for [place] to be able to work thru the ENT barriers for [facility].  Thanks,  Venora Maples

## 2021-04-20 ENCOUNTER — Ambulatory Visit: Payer: Self-pay | Attending: Internal Medicine | Admitting: Audiologist

## 2021-04-20 ENCOUNTER — Other Ambulatory Visit: Payer: Self-pay

## 2021-04-20 DIAGNOSIS — H9319 Tinnitus, unspecified ear: Secondary | ICD-10-CM | POA: Insufficient documentation

## 2021-04-20 DIAGNOSIS — H903 Sensorineural hearing loss, bilateral: Secondary | ICD-10-CM | POA: Insufficient documentation

## 2021-04-20 NOTE — Procedures (Signed)
Outpatient Audiology and [facility] [address] [place], [place]  [address] [phone]  AUDIOLOGICAL  EVALUATION  NAME: Phillip Wells     DOB:   [birth year]      MRN: [id number]                                                                                     DATE: 04/20/2021     REFERENT: Theotis Barrio, MD STATUS: Outpatient DIAGNOSIS: Sensorineural hearing loss, bilateral, Tinnitus, unspecified laterality   History: Phillip Wells has had a 2-year history of left ear tinnitus and long history of difficulty hearing.   Phillip Wells describes today a 2-year history of left ear tinnitus.  He states that around the time it started, he does not recall any new medications or changes to Phillip medical history at the time. Phillip Wells has never been injured on that side of the head. He does not recall any new medications or changes to Phillip medical history at the time. He is now finding that people need to speak up or he has to pay extra attention to hear what they are saying. Phillip Wells says he is always telling people that they are mumbling. He can hear but not understand. He denies any pulsatile nature to the tinnitus, and no alleviating or exacerbating factors.  He denies any dizziness, fullness, headaches, ear pain, ear drainage.  He denies any pulsatile nature to the tinnitus, and no alleviating or exacerbating factors.  He denies any dizziness, headaches, fullness, ear pain, ear drainage.    At Phillip job, there are consistently loud noises including frequent airgun discharges.  He does occasionally wear ear protection.I asked today if the the buzzing he hears ever occurs in the other ear, he says it can but its more in the left ear, but it can be hard to tell.  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 He sometimes wears hearing protection but not always. 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 They told him the air gun is the same volume as a 22 caliber. He says they never referred him or told him the results of Phillip hearing tests through OSHA.   Phillip last abnormal hearing test with this office was in 2016 with Phillip Wells. The results of that test showed a severe high frequency hearing loss in both ears. Hearing aids were recommended. Hearing was found to be retrocochlear. An evaluation with an ENT Physician due to the tinnitus was recommended in 2016. Back in 2016 he was reporting the tinnitus to be bilateral.   No other relevant case history reported.   Evaluation:   Otoscopy showed a partial view of the tympanic membranes, bilaterally  Tympanometry results were consistent with normal middle ear function bilaterally    Audiometric testing was completed using conventional audiometry with supraural high frequency transducer. Speech Recognition Thresholds were consistent with pure tone averages. Word Recognition was good in the right ear at fair in the left ear at an elevated level. Pure tone thresholds show normal sloping  steeply to a severe sensorineural hearing loss in both ears. 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   Results:  The test results were reviewed with Phillip Wells and Phillip Wells. Phillip Wells was counseled on the nature and degree of hiss hearing loss. he  was provided with several copies of Phillip audiogram that illustrate Phillip degree of hearing loss in both ears. Phillip hearing loss is in the high frequencies preventing Phillip Wells from hearing high frequency consonants such as /s/ /sh/ /f/ /t/ and /th/. These sounds help differentiate the words he  hears. Without these sounds, speech is muffled and unclear unless someone is face to face within 5 feet without a mask. Phillip Wells is an excellent hearing aid candidate. With hearing aids the clarity of speech will improve and Phillip Wells will not have to  work so hard to hear. Phillip Wells was provided with a list of audiologists that dispense hearing aids.    Recommendations: 1. Amplification is necessary for both ears. Hearing aids can be purchased from a variety of locations. See provided list for locations in the [place] area.  2. Referral to ENT Physician at discretion of primary care physician.    Ammie Ferrier  Audiologist, Au.D., CCC-A 04/20/2021  8:13 AM  Cc: Theotis Barrio, MD

## 2021-05-01 ENCOUNTER — Encounter: Payer: Self-pay | Admitting: *Deleted

## 2021-05-20 ENCOUNTER — Other Ambulatory Visit: Payer: Self-pay

## 2021-05-21 ENCOUNTER — Other Ambulatory Visit (HOSPITAL_COMMUNITY): Payer: Self-pay

## 2021-05-25 ENCOUNTER — Other Ambulatory Visit (HOSPITAL_COMMUNITY): Payer: Self-pay

## 2021-05-26 ENCOUNTER — Other Ambulatory Visit (HOSPITAL_COMMUNITY): Payer: Self-pay

## 2021-05-26 ENCOUNTER — Other Ambulatory Visit: Payer: Self-pay

## 2021-06-03 ENCOUNTER — Other Ambulatory Visit (HOSPITAL_COMMUNITY): Payer: Self-pay

## 2021-06-08 ENCOUNTER — Other Ambulatory Visit (HOSPITAL_COMMUNITY): Payer: Self-pay

## 2021-06-08 ENCOUNTER — Other Ambulatory Visit: Payer: Self-pay

## 2021-06-16 ENCOUNTER — Other Ambulatory Visit (HOSPITAL_COMMUNITY): Payer: Self-pay

## 2021-06-16 MED ORDER — LATANOPROST 0.005 % OP SOLN
1.0000 [drp] | Freq: Every evening | OPHTHALMIC | 6 refills | Status: DC
  Filled 2021-06-16: qty 2.5, 50d supply, fill #0

## 2021-06-17 ENCOUNTER — Other Ambulatory Visit (HOSPITAL_COMMUNITY): Payer: Self-pay

## 2021-06-22 ENCOUNTER — Ambulatory Visit (INDEPENDENT_AMBULATORY_CARE_PROVIDER_SITE_OTHER): Payer: Self-pay | Admitting: Otolaryngology

## 2021-06-22 ENCOUNTER — Other Ambulatory Visit: Payer: Self-pay

## 2021-06-22 DIAGNOSIS — H903 Sensorineural hearing loss, bilateral: Secondary | ICD-10-CM

## 2021-06-22 DIAGNOSIS — H6123 Impacted cerumen, bilateral: Secondary | ICD-10-CM

## 2021-06-22 DIAGNOSIS — H9313 Tinnitus, bilateral: Secondary | ICD-10-CM

## 2021-06-22 NOTE — Progress Notes (Signed)
HPI: Phillip Wells is a 70 y.o. male who presents is referred by Dr. Mayford Knife for evaluation of hearing loss in tinnitus in his ears.  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  He had type A tympanograms.Marland Kitchen He presents today with his daughter as well as a Nurse, learning disability.  Past Medical History:  Diagnosis Date   Coronary artery disease    s/p DES to the LAD   HTN (hypertension)    Hyperlipemia    Incomplete RBBB    Myocardial infarction (HCC) 11/09/10   Past Surgical History:  Procedure Laterality Date   CORONARY STENT PLACEMENT  November 2011   DES to LAD   Social History   Socioeconomic History   Marital status: Married    Spouse name: Not on file   Number of children: Not on file   Years of education: 3   Highest education level: Not on file  Occupational History   Occupation: CONSTRUCTION    Employer: [employer]  Tobacco Use   Smoking status: Former    Pack years: 0.00    Types: Cigarettes    Quit date: 01/31/1981    Years since quitting: 40.4   Smokeless tobacco: Never  Substance and Sexual Activity   Alcohol use: No    Alcohol/week: 0.0 standard drinks   Drug use: No   Sexual activity: Not on file  Other Topics Concern   Not on file  Social History Narrative   Not on file   Social Determinants of Health   Financial Resource Strain: Not on file  Food Insecurity: Not on file  Transportation Needs: Not on file  Physical Activity: Not on file  Stress: Not on file  Social Connections: Not on file   Family History  Problem Relation Age of Onset   Heart disease Neg Hx    No Known Allergies Prior to Admission medications   Medication Sig Start Date End Date Taking? Authorizing Provider  amLODipine (NORVASC) 10 MG tablet Take 1 tablet (10 mg total) by mouth daily. 05/29/20   Theotis Barrio, MD  aspirin (ASPIRIN ADULT LOW STRENGTH) 81 MG EC tablet  Take 1 tablet (81 mg total) by mouth daily. Swallow whole. 05/26/20   Theotis Barrio, MD  atorvastatin (LIPITOR) 40 MG tablet Take 1 tablet (40 mg total) by mouth daily. 05/29/20   Theotis Barrio, MD  brimonidine (ALPHAGAN) 0.2 % ophthalmic solution Place 1 drop into both eyes 3 (three) times daily. 08/02/17   John Giovanni, MD  famotidine (PEPCID) 20 MG tablet Take 1 tablet (20 mg total) by mouth 2 (two) times daily as needed for heartburn or indigestion. 06/04/19 06/03/20  Synetta Fail, MD  latanoprost (XALATAN) 0.005 % ophthalmic solution Place 1 drop into both eyes at bedtime. 08/02/17   John Giovanni, MD  latanoprost (XALATAN) 0.005 % ophthalmic solution Place 1 drop into the left eye  nightly 06/16/21     lisinopril (ZESTRIL) 40 MG tablet TAKE 1 Tablet BY MOUTH ONCE DAILY 05/26/20   Theotis Barrio, MD  metFORMIN (GLUCOPHAGE) 500 MG tablet Take 1 tablet (500 mg total) by mouth 2 (two) times daily with a meal. 05/26/20   Theotis Barrio, MD  metoprolol tartrate (LOPRESSOR) 25 MG tablet Take 1 tablet (25 mg total) by mouth 2 (two) times daily. 05/26/20   Theotis Barrio, MD  NITROSTAT 0.4 MG SL tablet PLACE 1 TABLET UNDER  TONGUE AS NEEDED FOR CHEST PAIN EVERY 5 MINUTES X 3 MAX DOSES.  CALL 911 IF PAIN PERSISTS 02/27/18   Ginger Carne, MD  timolol (TIMOPTIC) 0.5 % ophthalmic solution INSTILL 1 DROP IN [facility] EYE TWICE DAILY 05/21/18   Ginger Carne, MD     Positive ROS: Otherwise negative  All other systems have been reviewed and were otherwise negative with the exception of those mentioned in the HPI and as above.  Physical Exam: Constitutional: Alert, well-appearing, no acute distress Ears: External ears without lesions or tenderness.  He has small amount of wax buildup in both ear canals as he is used Q-tips in the past and this was adjacent to the TM.  This was cleaned with suction and curettes.  The TMs were clear bilaterally otherwise. Nasal: External nose without lesions.. Clear nasal  passages Oral: Lips and gums without lesions. Tongue and palate mucosa without lesions. Posterior oropharynx clear. Neck: No palpable adenopathy or masses Respiratory: Breathing comfortably  Skin: No facial/neck lesions or rash noted.  Cerumen impaction removal  Date/Time: 06/22/2021 5:11 PM Performed by: Drema Halon, MD Authorized by: Drema Halon, MD   Consent:    Consent obtained:  Verbal   Consent given by:  Patient   Risks discussed:  Pain and bleeding Procedure details:    Location:  L ear and R ear   Procedure type: curette and suction   Post-procedure details:    Inspection:  TM intact and canal normal   Hearing quality:  Improved   Procedure completion:  Tolerated well, no immediate complications Comments:     TMs are clear bilaterally.  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  Plan: Reviewed with the patient as well as his daughter that he has bilateral sensorineural hearing loss and would benefit from use of hearing aids.  Recommended following up with audiology concerning obtaining hearing aids either with [facility] or [facility]. Also cautioned him about not using Q-tips in his ears.   Narda Bonds, MD   CC:

## 2021-07-12 ENCOUNTER — Other Ambulatory Visit: Payer: Self-pay | Admitting: Internal Medicine

## 2021-07-12 DIAGNOSIS — E119 Type 2 diabetes mellitus without complications: Secondary | ICD-10-CM

## 2021-07-12 DIAGNOSIS — I2510 Atherosclerotic heart disease of native coronary artery without angina pectoris: Secondary | ICD-10-CM

## 2021-07-12 DIAGNOSIS — I10 Essential (primary) hypertension: Secondary | ICD-10-CM

## 2021-08-04 ENCOUNTER — Other Ambulatory Visit: Payer: Self-pay

## 2021-08-24 ENCOUNTER — Other Ambulatory Visit (HOSPITAL_COMMUNITY): Payer: Self-pay

## 2021-08-27 ENCOUNTER — Other Ambulatory Visit (HOSPITAL_COMMUNITY): Payer: Self-pay

## 2021-08-27 MED ORDER — DORZOLAMIDE HCL-TIMOLOL MAL 2-0.5 % OP SOLN
1.0000 [drp] | Freq: Two times a day (BID) | OPHTHALMIC | 11 refills | Status: DC
  Filled 2021-08-27: qty 10, 50d supply, fill #0
  Filled 2022-05-24: qty 10, 50d supply, fill #1

## 2021-08-31 ENCOUNTER — Other Ambulatory Visit (HOSPITAL_COMMUNITY): Payer: Self-pay

## 2021-08-31 LAB — HM DIABETES EYE EXAM

## 2021-08-31 MED ORDER — DORZOLAMIDE HCL-TIMOLOL MAL 2-0.5 % OP SOLN
1.0000 [drp] | Freq: Two times a day (BID) | OPHTHALMIC | 11 refills | Status: DC
  Filled 2021-08-31: qty 10, 50d supply, fill #0

## 2021-08-31 MED ORDER — LATANOPROST 0.005 % OP SOLN
1.0000 [drp] | Freq: Every evening | OPHTHALMIC | 12 refills | Status: DC
  Filled 2021-08-31 – 2021-09-17 (×2): qty 2.5, 50d supply, fill #0
  Filled 2021-12-07: qty 2.5, 50d supply, fill #1
  Filled 2022-01-20: qty 2.5, 50d supply, fill #2
  Filled 2022-05-12: qty 2.5, 50d supply, fill #3
  Filled 2022-07-28: qty 2.5, 50d supply, fill #4

## 2021-09-09 ENCOUNTER — Other Ambulatory Visit (HOSPITAL_COMMUNITY): Payer: Self-pay

## 2021-09-17 ENCOUNTER — Other Ambulatory Visit (HOSPITAL_COMMUNITY): Payer: Self-pay

## 2021-12-07 ENCOUNTER — Other Ambulatory Visit (HOSPITAL_COMMUNITY): Payer: Self-pay

## 2022-01-20 ENCOUNTER — Other Ambulatory Visit (HOSPITAL_COMMUNITY): Payer: Self-pay

## 2022-05-13 ENCOUNTER — Other Ambulatory Visit (HOSPITAL_COMMUNITY): Payer: Self-pay

## 2022-05-24 ENCOUNTER — Other Ambulatory Visit (HOSPITAL_COMMUNITY): Payer: Self-pay

## 2022-06-04 ENCOUNTER — Encounter: Payer: Self-pay | Admitting: *Deleted

## 2022-07-05 ENCOUNTER — Other Ambulatory Visit: Payer: Self-pay | Admitting: Internal Medicine

## 2022-07-05 DIAGNOSIS — I2510 Atherosclerotic heart disease of native coronary artery without angina pectoris: Secondary | ICD-10-CM

## 2022-07-05 DIAGNOSIS — I10 Essential (primary) hypertension: Secondary | ICD-10-CM

## 2022-07-05 DIAGNOSIS — E119 Type 2 diabetes mellitus without complications: Secondary | ICD-10-CM

## 2022-07-05 NOTE — Telephone Encounter (Signed)
LOV 01/19/21. Called pt - his granddaughter answered the telephone and speaks [place]. Informed of pt's LOV and pt needs to schedule an appt. Call transferred to front office to schedule - Appt schedule w/PCP 8/28.

## 2022-07-29 ENCOUNTER — Other Ambulatory Visit (HOSPITAL_COMMUNITY): Payer: Self-pay

## 2022-08-08 ENCOUNTER — Ambulatory Visit (INDEPENDENT_AMBULATORY_CARE_PROVIDER_SITE_OTHER): Payer: Self-pay | Admitting: Student

## 2022-08-08 VITALS — BP 164/82 | HR 52 | Temp 98.7°F | Ht 66.0 in | Wt 190.6 lb

## 2022-08-08 DIAGNOSIS — Z87891 Personal history of nicotine dependence: Secondary | ICD-10-CM

## 2022-08-08 DIAGNOSIS — E119 Type 2 diabetes mellitus without complications: Secondary | ICD-10-CM

## 2022-08-08 DIAGNOSIS — I2510 Atherosclerotic heart disease of native coronary artery without angina pectoris: Secondary | ICD-10-CM

## 2022-08-08 DIAGNOSIS — Z7984 Long term (current) use of oral hypoglycemic drugs: Secondary | ICD-10-CM

## 2022-08-08 DIAGNOSIS — H409 Unspecified glaucoma: Secondary | ICD-10-CM

## 2022-08-08 DIAGNOSIS — Z5989 Other problems related to housing and economic circumstances: Secondary | ICD-10-CM

## 2022-08-08 DIAGNOSIS — K219 Gastro-esophageal reflux disease without esophagitis: Secondary | ICD-10-CM

## 2022-08-08 DIAGNOSIS — I10 Essential (primary) hypertension: Secondary | ICD-10-CM

## 2022-08-08 LAB — POCT GLYCOSYLATED HEMOGLOBIN (HGB A1C): Hemoglobin A1C: 7 % — AB (ref 4.0–5.6)

## 2022-08-08 LAB — GLUCOSE, CAPILLARY: Glucose-Capillary: 122 mg/dL — ABNORMAL HIGH (ref 70–99)

## 2022-08-08 MED ORDER — FAMOTIDINE 20 MG PO TABS: 20.0000 mg | ORAL_TABLET | Freq: Two times a day (BID) | ORAL | 2 refills | Status: DC | PRN

## 2022-08-08 MED ORDER — AMLODIPINE BESYLATE 10 MG PO TABS: ORAL_TABLET | ORAL | 3 refills | Status: DC

## 2022-08-08 MED ORDER — HYDROCHLOROTHIAZIDE 12.5 MG PO TABS: 12.5000 mg | ORAL_TABLET | Freq: Every day | ORAL | 3 refills | Status: DC

## 2022-08-08 MED ORDER — ATORVASTATIN CALCIUM 40 MG PO TABS: ORAL_TABLET | ORAL | 3 refills | Status: DC

## 2022-08-08 MED ORDER — LISINOPRIL 40 MG PO TABS: 40.0000 mg | ORAL_TABLET | Freq: Every day | ORAL | 3 refills | Status: DC

## 2022-08-08 MED ORDER — ASPIRIN 81 MG PO TBEC: DELAYED_RELEASE_TABLET | ORAL | 3 refills | Status: DC

## 2022-08-08 MED ORDER — METFORMIN HCL 500 MG PO TABS
500.0000 mg | ORAL_TABLET | Freq: Two times a day (BID) | ORAL | 3 refills | Status: DC
Start: 2022-08-08 — End: 2023-10-03

## 2022-08-08 MED ORDER — METOPROLOL TARTRATE 25 MG PO TABS: 25.0000 mg | ORAL_TABLET | Freq: Two times a day (BID) | ORAL | 3 refills | Status: DC

## 2022-08-08 NOTE — Progress Notes (Signed)
Subjective:  CC: burning upper abdominal pain  HPI:  [name] is a 71 y.o. male with a past medical history stated below and presents today for routine follow-up, medication refill, and evaluation of upper abdominal pain. Please see problem based assessment and plan for additional details.  Patient's abdominal pain has been ongoing for some time now.  It is described as burning.  It is localized to the epigastric region.  Sometimes it radiates through his chest to his throat.  It is worse after eating spicy food.  It is not worse with exertion.  Patient complains it feels like acid reflux.  It feels distinct from his prior episodes of cardiac related chest pain.  He denies night time cough and shortness of breath.  He has not been taking anything for recently, but he used to take famotidine to good effect.  Patient spends most days working at [employer].  He enjoys spending weekends with his family and attending church.  Past Medical History:  Diagnosis Date   Coronary artery disease    s/p DES to the LAD   HTN (hypertension)    Hyperlipemia    Incomplete RBBB    Myocardial infarction (HCC) 11/09/10    Current Outpatient Medications on File Prior to Visit  Medication Sig Dispense Refill   brimonidine (ALPHAGAN) 0.2 % ophthalmic solution Place 1 drop into both eyes 3 (three) times daily. 5 mL 0   dorzolamide-timolol (COSOPT) 22.3-6.8 MG/ML ophthalmic solution Place 1 drop into both eyes 2 (two) times daily. 10 mL 11   dorzolamide-timolol (COSOPT) 22.3-6.8 MG/ML ophthalmic solution Place 1 drop into both eyes 2 (two) times daily. 10 mL 11   latanoprost (XALATAN) 0.005 % ophthalmic solution Place 1 drop into both eyes at bedtime. 2.5 mL 0   latanoprost (XALATAN) 0.005 % ophthalmic solution Place 1 drop into the left eye  nightly 2.5 mL 6   latanoprost (XALATAN) 0.005 % ophthalmic solution Place 1 drop into the left eye nightly. 2.5 mL 12   NITROSTAT 0.4 MG SL  tablet PLACE 1 TABLET UNDER TONGUE AS NEEDED FOR CHEST PAIN EVERY 5 MINUTES X 3 MAX DOSES.  CALL 911 IF PAIN PERSISTS 25 tablet 3   timolol (TIMOPTIC) 0.5 % ophthalmic solution INSTILL 1 DROP IN EACH EYE TWICE DAILY 10 mL 1   No current facility-administered medications on file prior to visit.    Family History  Problem Relation Age of Onset   Heart disease Neg Hx     Social History   Socioeconomic History   Marital status: Married    Spouse name: Not on file   Number of children: Not on file   Years of education: 3   Highest education level: Not on file  Occupational History   Occupation: CONSTRUCTION    Employer: [employer]  Tobacco Use   Smoking status: Former    Types: Cigarettes    Quit date: 01/31/1981    Years since quitting: 41.5   Smokeless tobacco: Never  Substance and Sexual Activity   Alcohol use: No    Alcohol/week: 0.0 standard drinks of alcohol   Drug use: No   Sexual activity: Not on file  Other Topics Concern   Not on file  Social History Narrative   Not on file   Social Determinants of Health   Financial Resource Strain: Not on file  Food Insecurity: Not on file  Transportation Needs: Not on file  Physical Activity: Not on file  Stress: Not on file  Social Connections: Not on file  Intimate Partner Violence: Not on file    Review of Systems: ROS negative except for what is noted on the assessment and plan.  Objective:   Vitals:   08/08/22 1348 08/08/22 1436  BP: (!) 181/83 (!) 164/82  Pulse: (!) 55 (!) 52  Temp: 98.7 F (37.1 C)   TempSrc: Oral   SpO2: 99%   Weight: 190 lb 9.6 oz (86.5 kg)   Height: 5\' 6"  (1.676 m)     Physical Exam: Constitutional: well-appearing male sitting on exam table, in no acute distress HENT: normocephalic atraumatic, mucous membranes moist Cardiovascular: Regular rate and rhythm pulmonary/Chest: normal work of breathing on room air, lungs clear to auscultation bilaterally Abdominal: soft, non-tender,  non-distended MSK: normal bulk and tone.  No lower extremity swelling. Neurological: alert & oriented x 3, 5/5 strength in bilateral upper and lower extremities, normal gait Skin: warm and dry Psych: Appropriate mood and affect   Assessment & Plan:  Essential hypertension Blood pressure today initially 181/83.  After period of rest decreased to 164/82.  Patient is taking his medicines as prescribed, per his report.  I will add hydrochlorothiazide 12.5 mg daily to his regimen.  I have given him instructions to return to clinic in 2 weeks for a repeat blood pressure check.  At that time we will obtain BMP. - Start hydrochlorothiazide 12.5 mg daily - Continue amlodipine 10 mg daily - Continue lisinopril 40 mg daily - Continue metoprolol tartrate 25 mg twice daily  CAD (coronary artery disease) Patient without episodes of angina or anginal-like symptoms.  No orthopnea or PND.  Appears euvolemic on exam.  No changes to management. - Continue atorvastatin 40 mg daily - Continue metoprolol tartrate 25 mg twice daily  GERD (gastroesophageal reflux disease) Patient's description of epigastric pain consistent with GERD.  Has taken famotidine in the past to good effect.  Will prescribe famotidine today.  Check in in 2 weeks and patient returns for improvement in symptoms. - Start famotidine 20 mg twice daily as needed for acid reflux  Type 2 diabetes mellitus (HCC) Repeat POC hemoglobin A1c 7.  Up from 6.5 two years ago.  Patient without symptoms of hyperglycemia including increased urinary frequency and increased thirst.  Reports avoiding sugary drinks. - Continue metformin 500 mg twice daily with meals.  Glaucoma No complaints of eye pain or vision changes.  Family reports patient has ophthalmology follow-up in November.  Uninsured Patient is without insurance.  Family reports that he has been approved for free medical care through internal medicine clinic.  Will order ambulatory referral to  social work.    Patient seen with Dr. December, M.D. [facility]  PGY-1 Pager: [phone] Date 08/08/2022  Time 4:17 PM

## 2022-08-08 NOTE — Assessment & Plan Note (Signed)
Patient without episodes of angina or anginal-like symptoms.  No orthopnea or PND.  Appears euvolemic on exam.  No changes to management. - Continue atorvastatin 40 mg daily - Continue metoprolol tartrate 25 mg twice daily

## 2022-08-08 NOTE — Patient Instructions (Addendum)
Thank you, [name] for allowing [place] to provide your care today. Today we discussed high blood pressure, diabetes, acid reflux.  Your blood pressure is high.  This puts you at risk for heart disease and stroke.  I will prescribe an extra medicine called hydrochlorothiazide (HydroDIURIL) for your blood pressure.  Start taking 1 pill daily.  I am also going to start a medicine called famotidine (Pepcid) for acid reflux.  You may take this medicine twice daily for your symptoms.  Continue taking the rest of your medicines as you have been.  Please return to the clinic in 2 weeks to have your blood pressure checked again.  Please remember to bring all of your medicines.  At that time we will also draw blood to monitor your high blood pressure and diabetes. - Amlodipine 10 mg once daily - Aspirin 81 mg once daily - Atorvastatin 40 mg once daily - Lisinopril 40 mg once daily - Metformin 500 mg twice daily - Metoprolol 25 mg twice daily  Hoy hablamos de presin arterial alta, diabetes y reflujo cido. Tu presin arterial es alta. Esto lo pone en riesgo de sufrir enfermedades cardacas y accidentes cerebrovasculares. Le recetar un medicamento adicional llamado hidroclorotiazida (HydroDIURIL) para su presin arterial. Empiece a tomar 1 pastilla al da. Tambin voy a empezar a Set designer llamado famotidina (Pepcid) para el reflujo cido. Puede tomar [employer] veces al da para tratar sus sntomas. Contine tomando el resto de sus medicamentos como Canon.  Regrese a Glass blower/designer en 2 semanas para que le revisen nuevamente la presin arterial. Recuerde traer todos sus medicamentos. En ese momento tambin le extraeremos sangre para controlar su presin arterial alta y diabetes. - Amlodipino 10 mg una vez al da - Aspirina 81 mg una vez al da - Atorvastatina 40 mg una vez al da - Lisinopril 40 mg una vez al da - Metformina 500 mg [employer] al da - Metoprolol 25 mg [employer]  I have ordered the following labs for you:   Lab Orders         Microalbumin / Creatinine Urine Ratio         H. pylori antigen, stool         POC Hbg A1C      Referrals ordered today:   Referral Orders  No referral(s) requested today     I have ordered the following medication/changed the following medications:   Start the following medications: Meds ordered this encounter  Medications   amLODipine (NORVASC) 10 MG tablet    Sig: TAKE 1 Tablet BY MOUTH ONCE EVERY DAY    Dispense:  90 tablet    Refill:  3   aspirin EC (ASPIRIN LOW DOSE) 81 MG tablet    Sig: TAKE 1 Tablet BY MOUTH ONCE DAILY (DO NOT CRUSH OR CHEW. SWALLOW WHOLE)    Dispense:  90 tablet    Refill:  3   atorvastatin (LIPITOR) 40 MG tablet    Sig: TAKE 1 Tablet BY MOUTH ONCE EVERY DAY    Dispense:  90 tablet    Refill:  3   famotidine (PEPCID) 20 MG tablet    Sig: Take 1 tablet (20 mg total) by mouth 2 (two) times daily as needed for heartburn or indigestion.    Dispense:  180 tablet    Refill:  2   lisinopril (ZESTRIL) 40 MG tablet    Sig: Take 1 tablet (40 mg total) by  mouth daily.    Dispense:  90 tablet    Refill:  3   metFORMIN (GLUCOPHAGE) 500 MG tablet    Sig: Take 1 tablet (500 mg total) by mouth 2 (two) times daily with a meal.    Dispense:  180 tablet    Refill:  3   metoprolol tartrate (LOPRESSOR) 25 MG tablet    Sig: Take 1 tablet (25 mg total) by mouth 2 (two) times daily.    Dispense:  180 tablet    Refill:  3   hydrochlorothiazide (HYDRODIURIL) 12.5 MG tablet    Sig: Take 1 tablet (12.5 mg total) by mouth daily.    Dispense:  90 tablet    Refill:  3     Follow up:  2 weeks    Remember: Please bring all of your medicines to your next appointment.  Por favor traiga todos sus medicamentos a su prxima cita.  Esperamos verte la prxima vez. Llame a nuestra clnica al [phone] si tiene alguna pregunta o inquietud. El mejor momento para llamar es de lunes a viernes de 9  a. m. a 4 p. m., pero hay alguien disponible las 24 horas, los 7 [address]  [address] de la [place]. Si es fuera del horario de atencin o durante el fin de [place], llame al nmero principal del hospital y pregunte por el residente de guardia de medicina interna. Si necesita reabastecimiento de medicamentos, notifique a su farmacia con una semana de anticipacin y ellos nos enviarn una solicitud.   Phillip Mood, MD [facility]

## 2022-08-08 NOTE — Assessment & Plan Note (Signed)
Patient's description of epigastric pain consistent with GERD.  Has taken famotidine in the past to good effect.  Will prescribe famotidine today.  Check in in 2 weeks and patient returns for improvement in symptoms. - Start famotidine 20 mg twice daily as needed for acid reflux - Follow up H. Pylori stool antigen test - Ambulatory referral to GI for EGD

## 2022-08-08 NOTE — Assessment & Plan Note (Signed)
No complaints of eye pain or vision changes.  Family reports patient has ophthalmology follow-up in November.

## 2022-08-08 NOTE — Assessment & Plan Note (Addendum)
Patient is without insurance.  Family reports that he has been approved for free medical care through internal medicine clinic.  Will order ambulatory referral to social work.

## 2022-08-08 NOTE — Assessment & Plan Note (Signed)
Blood pressure today initially 181/83.  After period of rest decreased to 164/82.  Patient is taking his medicines as prescribed, per his report.  I will add hydrochlorothiazide 12.5 mg daily to his regimen.  I have given him instructions to return to clinic in 2 weeks for a repeat blood pressure check.  At that time we will obtain BMP. - Start hydrochlorothiazide 12.5 mg daily - Continue amlodipine 10 mg daily - Continue lisinopril 40 mg daily - Continue metoprolol tartrate 25 mg twice daily

## 2022-08-08 NOTE — Assessment & Plan Note (Addendum)
Repeat POC hemoglobin A1c 7.  Up from 6.5 two years ago.  Patient without symptoms of hyperglycemia including increased urinary frequency and increased thirst.  Reports avoiding sugary drinks. - Continue metformin 500 mg twice daily with meals.

## 2022-08-09 LAB — MICROALBUMIN / CREATININE URINE RATIO
Creatinine, Urine: 122.3 mg/dL
Microalb/Creat Ratio: 15 mg/g creat (ref 0–29)
Microalbumin, Urine: 18.5 ug/mL

## 2022-08-10 NOTE — Addendum Note (Signed)
Addended by: Marrianne Mood on: 08/10/2022 05:08 PM   Modules accepted: Orders

## 2022-08-10 NOTE — Progress Notes (Signed)
Internal Medicine Clinic Attending  I saw and evaluated the patient.  I personally confirmed the key portions of the history and exam documented by Dr. Benito Mccreedy and I reviewed pertinent patient test results.  The assessment, diagnosis, and plan were formulated together and I agree with the documentation in the resident's note.  Patient has a history of worsening GERD and would likely need H. Pylori stool Ag test and possible EGD for further eval given new onset GERD>60 yrs of age

## 2022-08-18 ENCOUNTER — Other Ambulatory Visit: Payer: Self-pay

## 2022-08-20 LAB — H. PYLORI ANTIGEN, STOOL: H pylori Ag, Stl: NEGATIVE

## 2022-08-23 ENCOUNTER — Ambulatory Visit (INDEPENDENT_AMBULATORY_CARE_PROVIDER_SITE_OTHER): Payer: Self-pay | Admitting: Student

## 2022-08-23 VITALS — BP 130/67 | HR 53 | Wt 184.6 lb

## 2022-08-23 DIAGNOSIS — Z23 Encounter for immunization: Secondary | ICD-10-CM

## 2022-08-23 DIAGNOSIS — E875 Hyperkalemia: Secondary | ICD-10-CM

## 2022-08-23 DIAGNOSIS — K219 Gastro-esophageal reflux disease without esophagitis: Secondary | ICD-10-CM

## 2022-08-23 DIAGNOSIS — I10 Essential (primary) hypertension: Secondary | ICD-10-CM

## 2022-08-23 NOTE — Assessment & Plan Note (Signed)
Patient has not had any symptoms of GERD since his last visit.  He is not in possession of his famotidine.  Given this patient's history of severe GERD, I have a low threshold for referral to GI for upper endoscopy if his symptoms return.

## 2022-08-23 NOTE — Assessment & Plan Note (Addendum)
Patient is feeling well today.  No new complaints.  Apparently, he is not in possession of the hydrochlorothiazide that was prescribed at his last visit.  However his clinic blood pressure today is 130/67.  Given this improved blood pressure I do not feel strongly that patient take hydrochlorothiazide.  However, I will repeat a BMP today since he has not had screening for kidney function in 2 years. - Continue amlodipine 10 mg daily - Continue lisinopril 40 mg daily - Continue metoprolol tartrate 25 mg twice daily - Follow-up BMP  ADDENDUM: Patient's BMP returned with potassium of 5.6.  Previously normal.  I recommended that he hold his lisinopril.  I instructed him to return to clinic on 08/25/2022 for repeat stat BMP.  Repeat BMP on 08/25/22 showed improvement in potassium to 4.5. I told him he could restart lisinopril at that time. He should have BMP checked again at next clinic visit.

## 2022-08-23 NOTE — Progress Notes (Signed)
Subjective:  Reason for visit: Follow-up for hypertension  HPI:  [name] is a 71 y.o. male with a past medical history stated below and presents today for follow-up for hypertension. Please see problem based assessment and plan for additional details.  This patient was interviewed with the help of an in person and video.  Past Medical History:  Diagnosis Date   Coronary artery disease    s/p DES to the LAD   HTN (hypertension)    Hyperlipemia    Incomplete RBBB    Myocardial infarction (HCC) 11/09/10    Current Outpatient Medications on File Prior to Visit  Medication Sig Dispense Refill   amLODipine (NORVASC) 10 MG tablet TAKE 1 Tablet BY MOUTH ONCE EVERY DAY 90 tablet 3   aspirin EC (ASPIRIN LOW DOSE) 81 MG tablet TAKE 1 Tablet BY MOUTH ONCE DAILY (DO NOT CRUSH OR CHEW. SWALLOW WHOLE) 90 tablet 3   atorvastatin (LIPITOR) 40 MG tablet TAKE 1 Tablet BY MOUTH ONCE EVERY DAY 90 tablet 3   brimonidine (ALPHAGAN) 0.2 % ophthalmic solution Place 1 drop into both eyes 3 (three) times daily. 5 mL 0   dorzolamide-timolol (COSOPT) 22.3-6.8 MG/ML ophthalmic solution Place 1 drop into both eyes 2 (two) times daily. 10 mL 11   dorzolamide-timolol (COSOPT) 22.3-6.8 MG/ML ophthalmic solution Place 1 drop into both eyes 2 (two) times daily. 10 mL 11   famotidine (PEPCID) 20 MG tablet Take 1 tablet (20 mg total) by mouth 2 (two) times daily as needed for heartburn or indigestion. 180 tablet 2   hydrochlorothiazide (HYDRODIURIL) 12.5 MG tablet Take 1 tablet (12.5 mg total) by mouth daily. 90 tablet 3   latanoprost (XALATAN) 0.005 % ophthalmic solution Place 1 drop into both eyes at bedtime. 2.5 mL 0   latanoprost (XALATAN) 0.005 % ophthalmic solution Place 1 drop into the left eye  nightly 2.5 mL 6   latanoprost (XALATAN) 0.005 % ophthalmic solution Place 1 drop into the left eye nightly. 2.5 mL 12   lisinopril (ZESTRIL) 40 MG tablet Take 1 tablet (40 mg total) by mouth  daily. 90 tablet 3   metFORMIN (GLUCOPHAGE) 500 MG tablet Take 1 tablet (500 mg total) by mouth 2 (two) times daily with a meal. 180 tablet 3   metoprolol tartrate (LOPRESSOR) 25 MG tablet Take 1 tablet (25 mg total) by mouth 2 (two) times daily. 180 tablet 3   NITROSTAT 0.4 MG SL tablet PLACE 1 TABLET UNDER TONGUE AS NEEDED FOR CHEST PAIN EVERY 5 MINUTES X 3 MAX DOSES.  CALL 911 IF PAIN PERSISTS 25 tablet 3   timolol (TIMOPTIC) 0.5 % ophthalmic solution INSTILL 1 DROP IN EACH EYE TWICE DAILY 10 mL 1   No current facility-administered medications on file prior to visit.    Family History  Problem Relation Age of Onset   Heart disease Neg Hx     Social History   Socioeconomic History   Marital status: Married    Spouse name: Not on file   Number of children: Not on file   Years of education: 3   Highest education level: Not on file  Occupational History   Occupation: CONSTRUCTION    Employer: [employer]  Tobacco Use   Smoking status: Former    Types: Cigarettes    Quit date: 01/31/1981    Years since quitting: 41.5   Smokeless tobacco: Never  Substance and Sexual Activity   Alcohol use: No    Alcohol/week: 0.0 standard  drinks of alcohol   Drug use: No   Sexual activity: Not on file  Other Topics Concern   Not on file  Social History Narrative   Not on file   Social Determinants of Health   Financial Resource Strain: Not on file  Food Insecurity: Not on file  Transportation Needs: Not on file  Physical Activity: Not on file  Stress: Not on file  Social Connections: Not on file  Intimate Partner Violence: Not on file    Review of Systems: ROS negative except for what is noted on the assessment and plan.  Objective:   Vitals:   08/23/22 1547  BP: 130/67  Pulse: (!) 53  SpO2: 99%  Weight: 184 lb 9.6 oz (83.7 kg)    Physical Exam: General: Well-appearing male. Cardiovascular: Regular rate and rhythm. Pulmonary: Normal work of reading.  Lungs clear to  auscultation. Skin: Warm and dry. Neurologic: Alert and oriented. Psychiatric: Pleasant.  Appropriate mood and affect.   Assessment & Plan:  Essential hypertension Patient is feeling well today.  No new complaints.  Apparently, he is not in possession of the hydrochlorothiazide that was prescribed at his last visit.  However his clinic blood pressure today is 130/67.  Given this improved blood pressure I do not feel strongly that patient take hydrochlorothiazide.  However, I will repeat a BMP today since he has not had screening for kidney function in 2 years. - Continue amlodipine 10 mg daily - Continue lisinopril 40 mg daily - Continue metoprolol tartrate 25 mg twice daily - Follow-up BMP  GERD (gastroesophageal reflux disease) Patient has not had any symptoms of GERD since his last visit.  He is not in possession of his famotidine.  Given this patient's history of severe GERD, I have a low threshold for referral to GI for upper endoscopy if his symptoms return.   Patient seen with Dr. Durene Romans, M.D. [facility]  PGY-1 Pager: [phone] Date 08/23/2022  Time 10:04 PM

## 2022-08-23 NOTE — Patient Instructions (Signed)
Phillip Wells, [name] por permitirnos brindarle su atencin hoy. Hoy hablamos de la presin arterial alta.  Aunque no haya comenzado a tomar el tercer medicamento para la presin arterial (llamado HCTZ o hidroclorotiazida), su presin arterial hoy ha mejorado mucho.  Sin embargo, para prevenir ataques cardacos y accidentes cerebrovasculares, creo que es importante comenzar a tomar el nuevo medicamento para la presin arterial cuando llegue.  Esto significa que tomar 3 medicamentos para la presin arterial alta, entre ellos: - Amlodipino - lisinopril - HCTZ (hidroclorotiazida)  Regrese a la clnica en 3 meses para una visita de seguimiento. ------------------------------------------------ Thank you, Phillip Wells for allowing [place] to provide your care today. Today we discussed high blood pressure.  Even though you have not started the third blood pressure medicine (called HCTZ, or hydrochlorothiazide) your blood pressure today is much improved.  However in order to prevent heart attack and stroke, I think it is important to start the new blood pressure medicine when it arrives.  This means you will take 3 medicines for high blood pressure including: - Amlodipine - Lisinopril - HCTZ (hydrochlorothiazide)  Please return to the clinic in 3 months for a follow-up visit.  We look forward to seeing you next time. Please call our clinic at [phone] if you have any questions or concerns. The best time to call is Monday-Friday from 9am-4pm, but there is someone available 24/7. If after hours or the weekend, call the main hospital number and ask for the Internal Medicine Resident On-Call. If you need medication refills, please notify your pharmacy one week in advance and they will send [place] a request.   Thank you for trusting me with your care. Wishing you the best!   Marrianne Mood, MD [facility]

## 2022-08-24 LAB — BMP8+ANION GAP
Anion Gap: 16 mmol/L (ref 10.0–18.0)
BUN/Creatinine Ratio: 28 — ABNORMAL HIGH (ref 10–24)
BUN: 28 mg/dL — ABNORMAL HIGH (ref 8–27)
CO2: 21 mmol/L (ref 20–29)
Calcium: 9.5 mg/dL (ref 8.6–10.2)
Chloride: 102 mmol/L (ref 96–106)
Creatinine, Ser: 0.99 mg/dL (ref 0.76–1.27)
Glucose: 97 mg/dL (ref 70–99)
Potassium: 5.6 mmol/L — ABNORMAL HIGH (ref 3.5–5.2)
Sodium: 139 mmol/L (ref 134–144)
eGFR: 80 mL/min/{1.73_m2} (ref >59)

## 2022-08-24 NOTE — Progress Notes (Signed)
Called patient with the assistance of a Spanish interpreter.  Spoke with the patient's daughter who escorted him to his appointment yesterday.  I informed her of the patient's elevated potassium.  I recommended returning to the clinic tomorrow for repeat BMP.  I recommended holding lisinopril for now.

## 2022-08-24 NOTE — Addendum Note (Signed)
Addended by: Marrianne Mood on: 08/24/2022 03:04 PM   Modules accepted: Orders

## 2022-08-24 NOTE — Progress Notes (Signed)
Internal Medicine Clinic Attending  I saw and evaluated the patient.  I personally confirmed the key portions of the history and exam documented by Dr. McLendon and I reviewed pertinent patient test results.  The assessment, diagnosis, and plan were formulated together and I agree with the documentation in the resident's note.  

## 2022-08-25 ENCOUNTER — Other Ambulatory Visit (INDEPENDENT_AMBULATORY_CARE_PROVIDER_SITE_OTHER): Payer: Self-pay

## 2022-08-25 DIAGNOSIS — E875 Hyperkalemia: Secondary | ICD-10-CM

## 2022-08-25 LAB — BASIC METABOLIC PANEL
Anion gap: 7 (ref 5–15)
BUN: 24 mg/dL — ABNORMAL HIGH (ref 8–23)
CO2: 23 mmol/L (ref 22–32)
Calcium: 8.8 mg/dL — ABNORMAL LOW (ref 8.9–10.3)
Chloride: 108 mmol/L (ref 98–111)
Creatinine, Ser: 0.93 mg/dL (ref 0.61–1.24)
GFR, Estimated: >60 mL/min (ref >60)
Glucose, Bld: 104 mg/dL — ABNORMAL HIGH (ref 70–99)
Potassium: 4.5 mmol/L (ref 3.5–5.1)
Sodium: 138 mmol/L (ref 135–145)

## 2022-09-27 ENCOUNTER — Encounter: Payer: Self-pay | Admitting: Dietician

## 2022-09-29 ENCOUNTER — Encounter: Payer: Self-pay | Admitting: Student

## 2022-10-10 ENCOUNTER — Ambulatory Visit (INDEPENDENT_AMBULATORY_CARE_PROVIDER_SITE_OTHER): Payer: Self-pay

## 2022-10-10 VITALS — BP 131/72 | HR 56 | Wt 192.6 lb

## 2022-10-10 DIAGNOSIS — Z87891 Personal history of nicotine dependence: Secondary | ICD-10-CM

## 2022-10-10 DIAGNOSIS — Z7984 Long term (current) use of oral hypoglycemic drugs: Secondary | ICD-10-CM

## 2022-10-10 DIAGNOSIS — E785 Hyperlipidemia, unspecified: Secondary | ICD-10-CM

## 2022-10-10 DIAGNOSIS — Z1211 Encounter for screening for malignant neoplasm of colon: Secondary | ICD-10-CM | POA: Insufficient documentation

## 2022-10-10 DIAGNOSIS — I10 Essential (primary) hypertension: Secondary | ICD-10-CM

## 2022-10-10 DIAGNOSIS — R600 Localized edema: Secondary | ICD-10-CM

## 2022-10-10 DIAGNOSIS — I2510 Atherosclerotic heart disease of native coronary artery without angina pectoris: Secondary | ICD-10-CM

## 2022-10-10 DIAGNOSIS — Z1159 Encounter for screening for other viral diseases: Secondary | ICD-10-CM | POA: Insufficient documentation

## 2022-10-10 DIAGNOSIS — E119 Type 2 diabetes mellitus without complications: Secondary | ICD-10-CM

## 2022-10-10 MED ORDER — HYDROCHLOROTHIAZIDE 12.5 MG PO TABS: 12.5000 mg | ORAL_TABLET | Freq: Every day | ORAL | 3 refills | Status: DC

## 2022-10-10 MED ORDER — AMLODIPINE BESYLATE 5 MG PO TABS: 5.0000 mg | ORAL_TABLET | Freq: Every day | ORAL | 11 refills | Status: DC

## 2022-10-10 NOTE — Assessment & Plan Note (Signed)
Most recent lipid panel in 2017 with LDL of 105, above goal of <70 given CAD and TIA history. -atorvastatin 40, titrate as needed -lipid panel

## 2022-10-10 NOTE — Assessment & Plan Note (Signed)
HCV screening performed today. 

## 2022-10-10 NOTE — Assessment & Plan Note (Addendum)
BP today in clinic was 131/72. Given history of CAD and TIA would favor more aggressive BP goal closer to 120/80. Patient recently discontinued from HCTZ given good BP control. Phillip Wells endorses LE edema that started around 2 years ago, stopped and came back for the past three weeks. Likely due to venous stasis, but amlodipine 10mg  could be contributory. Will decrease amlodipine dose and restart HCTZ. Patient with recent hyperkalemia, will repeat bmp today. Using mail in pharmacy that takes around 7 days for delivery. Will bring patient back for BP and BMP check in 3 weeks. -Start HCTZ 12.5mg  daily -Decrease amlodipine to 5 mg daily -continue lisinopril 40 -bmp

## 2022-10-10 NOTE — Assessment & Plan Note (Signed)
Referred to GI for colonoscopy 

## 2022-10-10 NOTE — Progress Notes (Deleted)
HTN No longer on HCTZ - Continue amlodipine 10 mg daily - Continue lisinopril 40 mg daily - Continue metoprolol tartrate 25 mg twice daily - Follow-up BMP given recent hyperkalemia  CAD HLD TIA DES to LAD 2011 LDL 105 2017, goal <70 -atorvastatin 40 Metoprolol 25 BID -asa81 -lipid panel  T2DM A1c 7 08/08/2022 Metformin 500 BID Optho  HCM colonoscopy

## 2022-10-10 NOTE — Progress Notes (Signed)
Established Patient Office Visit  Subjective   Patient ID: Phillip Wells, male    DOB: Apr 01, 1951  Age: 71 y.o. MRN: [id number]  Chief Complaint  Patient presents with   Hypertension   Foot Swelling    C/o swelling in both feet (worse on right feet) x 3wks     [name] is a 71 y/o male with a pmh outlined below. Please see encounter tab for HPI and A/P information.  Hypertension      Review of Systems  All other systems reviewed and are negative.     Objective:     BP 131/72 (BP Location: Left Arm, Patient Position: Sitting, Cuff Size: Normal)   Pulse (!) 56   Wt 192 lb 9.6 oz (87.4 kg)   SpO2 98%   BMI 31.09 kg/m    Physical Exam Constitutional:      General: He is not in acute distress.    Appearance: Normal appearance. He is obese.  Cardiovascular:     Rate and Rhythm: Normal rate and regular rhythm.     Pulses: Normal pulses.     Heart sounds: Normal heart sounds. No murmur heard.    No gallop.  Pulmonary:     Effort: Pulmonary effort is normal. No respiratory distress.     Breath sounds: Normal breath sounds. No wheezing or rales.  Musculoskeletal:     Right lower leg: No edema.     Left lower leg: No edema.     Comments: Surgical scar over the R medial malleolus, mild bilateral pes planus, no warmth/erythema/edema/deformity about the feet or ankle joints  Skin:    General: Skin is warm and dry.     Capillary Refill: Capillary refill takes less than 2 seconds.     Comments: Venous stasis dermatitis about the bilateral ankle join and distal anterior tibia  Neurological:     Mental Status: He is alert.      No results found for any visits on 10/10/22.    The ASCVD Risk score (Arnett DK, et al., 2019) failed to calculate for the following reasons:   Cannot find a previous HDL lab   Cannot find a previous total cholesterol lab    Assessment & Plan:   Problem List Items Addressed This Visit       Cardiovascular and  Mediastinum   Essential hypertension (Chronic)    BP today in clinic was 131/72. Given history of CAD and TIA would favor more aggressive BP goal closer to 120/80. Patient recently discontinued from HCTZ given good BP control. He endorses LE edema that started around 2 years ago, stopped and came back for the past three weeks. Likely due to venous stasis, but amlodipine 10mg  could be contributory. Will decrease amlodipine dose and restart HCTZ. Patient with recent hyperkalemia, will repeat bmp today. Using mail in pharmacy that takes around 7 days for delivery. Will bring patient back for BP and BMP check in 3 weeks. -Start HCTZ 12.5mg  daily -Decrease amlodipine to 5 mg daily -continue lisinopril 40 -bmp      Relevant Medications   amLODipine (NORVASC) 5 MG tablet   hydrochlorothiazide (HYDRODIURIL) 12.5 MG tablet   Other Relevant Orders   BMP8+Anion Gap   CAD (coronary artery disease) - Primary (Chronic)   Relevant Medications   amLODipine (NORVASC) 5 MG tablet   hydrochlorothiazide (HYDRODIURIL) 12.5 MG tablet   Other Relevant Orders   Lipid Profile     Endocrine   Type 2 diabetes mellitus ([place]) (  Chronic)    Opthalmology referral placed for retinal eye exam.      Relevant Orders   Ambulatory referral to Ophthalmology     Other   Hyperlipemia    Most recent lipid panel in 2017 with LDL of 105, above goal of <22 given CAD and TIA history. -atorvastatin 40, titrate as needed -lipid panel      Relevant Medications   amLODipine (NORVASC) 5 MG tablet   hydrochlorothiazide (HYDRODIURIL) 12.5 MG tablet   Other Relevant Orders   Lipid Profile   Bilateral lower extremity edema    Patient reports bilateral LE edema that started roughly 2 years ago, although per chart review appears to be around 3 years ago. He said this went away for the past year and has come back over the past three weeks. He denies CP, SOB, cough, PND, orthopnea. He says the edema worsens over the day, improves  with rest and elevation, is gone by morning. There is some associated pain when the skin gets tight. He has previously used compression stockings which helped. This is consistent with venous stasis and the timeline somewhat aligns with initiation and titration of amlodipine. Will encourage patient to continue rest/elevation/compression, decrease amlodipine and restart HCTZ with some benefit given diuretic effect.       Colon cancer screening    Referred to GI for colonoscopy.      Relevant Orders   Ambulatory referral to Gastroenterology   Encounter for HCV screening test for low risk patient    HCV screening performed today.      Relevant Orders   Hepatitis C Ab reflex to Quant PCR    No follow-ups on file.    Willette Cluster, MD

## 2022-10-10 NOTE — Assessment & Plan Note (Signed)
Patient reports bilateral LE edema that started roughly 2 years ago, although per chart review appears to be around 3 years ago. He said this went away for the past year and has come back over the past three weeks. He denies CP, SOB, cough, PND, orthopnea. He says the edema worsens over the day, improves with rest and elevation, is gone by morning. There is some associated pain when the skin gets tight. He has previously used compression stockings which helped. This is consistent with venous stasis and the timeline somewhat aligns with initiation and titration of amlodipine. Will encourage patient to continue rest/elevation/compression, decrease amlodipine and restart HCTZ with some benefit given diuretic effect.

## 2022-10-10 NOTE — Assessment & Plan Note (Signed)
Opthalmology referral placed for retinal eye exam.

## 2022-10-10 NOTE — Patient Instructions (Addendum)
Phillip Wells, SrCletus Gash P Wells por permitirnos brindarle su atencin hoy. Hoy discutimos:  Diabetes: su nivel de azcar en la sangre se ve bien, lo derivaremos al oftalmlogo para que le revise la vista.  Deteccin de cncer de colon: lo derivaremos para una colonoscopia.  Hinchazn de las piernas: esto no es peligroso y [place] se deba al estar de pie y tambin puede deberse a un medicamento que toma llamado amlodipino. Debes elevar las piernas al final del da y usar medias de compresin. Disminuiremos la dosis de amlodipino.  Presin arterial: disminuimos la amlodipina a 5 mg. Hemos reiniciado hidroclorotiazida 12,5 mg al da. ____________________________________________________________________ Thank you, [name] for allowing [place] to provide your care today. Today we discussed :  Diabetes- Your blood sugar looks good, we will refer you to the ophthalmologist to get your eyes checked.  Colon cancer screening- we will get you referred for colonoscopy.   Leg swelling- This in not dangerous and is likely from standing and may also be because of a medication you take called amlodipine. You should elevate your legs at the end of the day and wear compression stockings. We will decrease the dose of amlodipine.  Blood pressure- We decreased amlodipine to 5 mg. We have restarted hydrochlorothiazide 12.5 mg daily.  I have ordered the following labs for you:   Lab Orders         Lipid Profile         BMP8+Anion Gap         Hepatitis C Ab reflex to Quant PCR       Referrals ordered today:    Referral Orders         Ambulatory referral to Gastroenterology         Ambulatory referral to Ophthalmology      He ordenado/cambiado los siguientes medicamentos:  Suspenda los siguientes medicamentos: Medications Discontinued During This Encounter  Medication Reason   amLODipine (NORVASC) 10 MG tablet    hydrochlorothiazide (HYDRODIURIL) 12.5 MG tablet Reorder    amLODipine (NORVASC) 5 MG tablet      Inicie los siguientes medicamentos: Meds ordered this encounter  Medications   DISCONTD: amLODipine (NORVASC) 5 MG tablet    Sig: Take 1 tablet (5 mg total) by mouth daily.    Dispense:  30 tablet    Refill:  11   amLODipine (NORVASC) 5 MG tablet    Sig: Take 1 tablet (5 mg total) by mouth daily.    Dispense:  30 tablet    Refill:  11   hydrochlorothiazide (HYDRODIURIL) 12.5 MG tablet    Sig: Take 1 tablet (12.5 mg total) by mouth daily.    Dispense:  90 tablet    Refill:  3    Hacer un seguimiento:   3 weeks      Esperamos verte la prxima vez. Llame a nuestra clnica al [phone] si tiene alguna pregunta o inquietud. El mejor momento para llamar es de lunes a viernes de 9 a. m. a 4 p. m., pero hay alguien disponible las 24 horas, los 7 das de la [place]. Si es fuera del horario de atencin o durante el fin de [place], llame al nmero principal del hospital y pregunte por el residente de guardia de medicina interna. Si necesita reabastecimiento de medicamentos, notifique a su farmacia con una semana de anticipacin y ellos nos enviarn una solicitud.  Gracias por confiarme tu cuidado. Deseandote lo mejor!   Iona Coach, MD [place]

## 2022-10-11 LAB — HCV INTERPRETATION

## 2022-10-11 LAB — LIPID PANEL
Chol/HDL Ratio: 4.1 ratio (ref 0.0–5.0)
Cholesterol, Total: 178 mg/dL (ref 100–199)
HDL: 43 mg/dL (ref >39)
LDL Chol Calc (NIH): 87 mg/dL (ref 0–99)
Triglycerides: 292 mg/dL — ABNORMAL HIGH (ref 0–149)
VLDL Cholesterol Cal: 48 mg/dL — ABNORMAL HIGH (ref 5–40)

## 2022-10-11 LAB — BMP8+ANION GAP
Anion Gap: 15 mmol/L (ref 10.0–18.0)
BUN/Creatinine Ratio: 19 (ref 10–24)
BUN: 18 mg/dL (ref 8–27)
CO2: 22 mmol/L (ref 20–29)
Calcium: 9.5 mg/dL (ref 8.6–10.2)
Chloride: 103 mmol/L (ref 96–106)
Creatinine, Ser: 0.93 mg/dL (ref 0.76–1.27)
Glucose: 92 mg/dL (ref 70–99)
Potassium: 4.5 mmol/L (ref 3.5–5.2)
Sodium: 140 mmol/L (ref 134–144)
eGFR: 87 mL/min/{1.73_m2} (ref >59)

## 2022-10-11 LAB — HCV AB W REFLEX TO QUANT PCR: HCV Ab: NONREACTIVE

## 2022-10-12 NOTE — Progress Notes (Signed)
Internal Medicine Clinic Attending  I saw and evaluated the patient.  I personally confirmed the key portions of the history and exam documented by Dr. Rogers and I reviewed pertinent patient test results.  The assessment, diagnosis, and plan were formulated together and I agree with the documentation in the resident's note.  

## 2022-10-14 ENCOUNTER — Other Ambulatory Visit (HOSPITAL_COMMUNITY): Payer: Self-pay

## 2022-10-17 ENCOUNTER — Other Ambulatory Visit (HOSPITAL_COMMUNITY): Payer: Self-pay

## 2022-10-17 MED ORDER — LATANOPROST 0.005 % OP SOLN
1.0000 [drp] | Freq: Every evening | OPHTHALMIC | 12 refills | Status: DC
  Filled 2022-10-17: qty 2.5, 50d supply, fill #0
  Filled 2023-01-02 – 2023-02-07 (×2): qty 2.5, 50d supply, fill #1
  Filled 2023-05-18: qty 5, 100d supply, fill #2
  Filled 2023-09-19: qty 5, 50d supply, fill #3

## 2022-10-24 NOTE — Progress Notes (Signed)
Called patient with [employer] interpret[employer], answered by daughter who patient who patient has given permission to be contacted. Discussed normal kidney function, electrolytes, cholesterol and HCV/HIV screening. She will relay to the patient when he is available.

## 2022-11-07 ENCOUNTER — Encounter: Payer: Self-pay | Admitting: Student

## 2022-11-08 ENCOUNTER — Encounter: Payer: Self-pay | Admitting: Student

## 2022-11-15 ENCOUNTER — Other Ambulatory Visit (HOSPITAL_COMMUNITY): Payer: Self-pay

## 2022-11-15 MED ORDER — DORZOLAMIDE HCL-TIMOLOL MAL 2-0.5 % OP SOLN
1.0000 [drp] | Freq: Two times a day (BID) | OPHTHALMIC | 12 refills | Status: DC
  Filled 2022-11-15 – 2023-02-07 (×2): qty 10, 50d supply, fill #0
  Filled 2023-10-02: qty 10, 50d supply, fill #1

## 2022-11-15 MED ORDER — LATANOPROST 0.005 % OP SOLN
1.0000 [drp] | Freq: Every evening | OPHTHALMIC | 12 refills | Status: DC
  Filled 2022-11-15 – 2023-01-02 (×2): qty 2.5, 25d supply, fill #0

## 2022-11-23 ENCOUNTER — Other Ambulatory Visit (HOSPITAL_COMMUNITY): Payer: Self-pay

## 2023-01-02 ENCOUNTER — Other Ambulatory Visit (HOSPITAL_COMMUNITY): Payer: Self-pay

## 2023-02-07 ENCOUNTER — Other Ambulatory Visit (HOSPITAL_COMMUNITY): Payer: Self-pay

## 2023-02-07 ENCOUNTER — Other Ambulatory Visit: Payer: Self-pay

## 2023-02-09 ENCOUNTER — Other Ambulatory Visit (HOSPITAL_COMMUNITY): Payer: Self-pay

## 2023-03-08 ENCOUNTER — Encounter: Payer: Self-pay | Admitting: Student

## 2023-04-19 ENCOUNTER — Encounter: Payer: Self-pay | Admitting: Internal Medicine

## 2023-05-04 ENCOUNTER — Ambulatory Visit (AMBULATORY_SURGERY_CENTER): Payer: Self-pay

## 2023-05-04 VITALS — Ht 66.0 in | Wt 190.6 lb

## 2023-05-04 DIAGNOSIS — Z1211 Encounter for screening for malignant neoplasm of colon: Secondary | ICD-10-CM

## 2023-05-04 NOTE — Progress Notes (Signed)
No egg or soy allergy known to patient  No issues known to pt with past sedation with any surgeries or procedures Patient denies ever being told they had issues or difficulty with intubation  No FH of Malignant Hyperthermia Pt is not on diet pills Pt is not on  home 02  Pt is not on blood thinners  Pt denies issues with constipation  No A fib or A flutter Have any cardiac testing pending--no  Pt is ambulatory   Patient's chart reviewed by Phillip Wells CNRA prior to previsit and patient appropriate for the [facility].  Previsit completed and red dot placed by patient's name on their procedure day (on provider's schedule).     PV complete with Phillip Wells from interpreter services. Plenvu sample given at visit. Hard copy of instructions provided to the patient. All questions answered.  Pt instructed to use Single[employer]com or GoodRx for a price reduction on prep

## 2023-05-18 ENCOUNTER — Other Ambulatory Visit: Payer: Self-pay

## 2023-05-18 ENCOUNTER — Other Ambulatory Visit (HOSPITAL_COMMUNITY): Payer: Self-pay

## 2023-05-21 ENCOUNTER — Encounter: Payer: Self-pay | Admitting: Certified Registered Nurse Anesthetist

## 2023-05-22 ENCOUNTER — Encounter: Payer: Self-pay | Admitting: Internal Medicine

## 2023-05-25 ENCOUNTER — Ambulatory Visit (AMBULATORY_SURGERY_CENTER): Payer: Self-pay | Admitting: Internal Medicine

## 2023-05-25 ENCOUNTER — Encounter: Payer: Self-pay | Admitting: Internal Medicine

## 2023-05-25 VITALS — BP 120/65 | HR 50 | Temp 98.2°F | Resp 18 | Ht 66.0 in | Wt 190.6 lb

## 2023-05-25 DIAGNOSIS — Z1211 Encounter for screening for malignant neoplasm of colon: Secondary | ICD-10-CM

## 2023-05-25 DIAGNOSIS — D122 Benign neoplasm of ascending colon: Secondary | ICD-10-CM

## 2023-05-25 DIAGNOSIS — D125 Benign neoplasm of sigmoid colon: Secondary | ICD-10-CM

## 2023-05-25 DIAGNOSIS — D123 Benign neoplasm of transverse colon: Secondary | ICD-10-CM

## 2023-05-25 MED ORDER — SODIUM CHLORIDE 0.9 % IV SOLN: 500.0000 mL | INTRAVENOUS | Status: DC

## 2023-05-25 NOTE — Op Note (Signed)
[facility] Patient Name: Phillip Wells Procedure Date: 05/25/2023 8:02 AM MRN: [id number] Endoscopist: Iva Boop , MD, [id number] Age: 72 Referring MD:  Date of Birth: 25-Jun-1951 Gender: Male Account #: [id number] Procedure:                Colonoscopy Indications:              Screening for colorectal malignant neoplasm Medicines:                Monitored Anesthesia Care Procedure:                Pre-Anesthesia Assessment:                           - Prior to the procedure, a History and Physical                            was performed, and patient medications and                            allergies were reviewed. The patient's tolerance of                            previous anesthesia was also reviewed. The risks                            and benefits of the procedure and the sedation                            options and risks were discussed with the patient.                            All questions were answered, and informed consent                            was obtained. Prior Anticoagulants: The patient has                            taken no anticoagulant or antiplatelet agents. ASA                            Grade Assessment: III - A patient with severe                            systemic disease. After reviewing the risks and                            benefits, the patient was deemed in satisfactory                            condition to undergo the procedure.                           After obtaining informed consent, the colonoscope  was passed under direct vision. Throughout the                            procedure, the patient's blood pressure, pulse, and                            oxygen saturations were monitored continuously. The                            CF [id number] #[id number] was introduced through the anus                            and advanced to the the cecum, identified by                             appendiceal orifice and ileocecal valve. The                            colonoscopy was somewhat difficult due to                            significant looping. Successful completion of the                            procedure was aided by applying abdominal pressure.                            The patient tolerated the procedure well. The                            quality of the bowel preparation was excellent. The                            ileocecal valve, appendiceal orifice, and rectum                            were photographed. The bowel preparation used was                            Plenvu via split dose instruction. Scope In: 8:07:55 AM Scope Out: 8:25:46 AM Scope Withdrawal Time: 0 hours 13 minutes 33 seconds  Total Procedure Duration: 0 hours 17 minutes 51 seconds  Findings:                 The perianal and digital rectal examinations were                            normal. Pertinent negatives include normal prostate                            (size, shape, and consistency).                           Four sessile polyps were found in the sigmoid  colon, transverse colon and ascending colon. The                            polyps were diminutive in size. These polyps were                            removed with a cold snare. Resection and retrieval                            were complete. Verification of patient                            identification for the specimen was done. Estimated                            blood loss was minimal.                           The exam was otherwise without abnormality on                            direct and retroflexion views. Complications:            No immediate complications. Estimated Blood Loss:     Estimated blood loss was minimal. Impression:               - Four diminutive polyps in the sigmoid colon, in                            the transverse colon and in the ascending colon,                             removed with a cold snare. Resected and retrieved.                           - The examination was otherwise normal on direct                            and retroflexion views. Recommendation:           - Patient has a contact number available for                            emergencies. The signs and symptoms of potential                            delayed complications were discussed with the                            patient. Return to normal activities tomorrow.                            Written discharge instructions were provided to the  patient.                           - Resume previous diet.                           - Continue present medications.                           - No recommendation at this time regarding repeat                            colonoscopy due to age. Iva Boop, MD 05/25/2023 8:36:34 AM This report has been signed electronically.

## 2023-05-25 NOTE — Progress Notes (Signed)
Report given to PACU, vss 

## 2023-05-25 NOTE — Patient Instructions (Addendum)
I found and removed 4 tiny polyps. They look benign - and will be analyzed and I will let you know results.  All else ok - no signs of cancer.  I appreciate the opportunity to care for you. Iva Boop, MD, FACG  YOU HAD AN ENDOSCOPIC PROCEDURE TODAY AT THE [facility]:   Refer to the procedure report that was given to you for any specific questions about what was found during the examination.  If the procedure report does not answer your questions, please call your gastroenterologist to clarify.  If you requested that your care partner not be given the details of your procedure findings, then the procedure report has been included in a sealed envelope for you to review at your convenience later.  YOU SHOULD EXPECT: Some feelings of bloating in the abdomen. Passage of more gas than usual.  Walking can help get rid of the air that was put into your GI tract during the procedure and reduce the bloating. If you had a lower endoscopy (such as a colonoscopy or flexible sigmoidoscopy) you may notice spotting of blood in your stool or on the toilet paper. If you underwent a bowel prep for your procedure, you may not have a normal bowel movement for a few days.  Please Note:  You might notice some irritation and congestion in your nose or some drainage.  This is from the oxygen used during your procedure.  There is no need for concern and it should clear up in a day or so.  SYMPTOMS TO REPORT IMMEDIATELY:  Following lower endoscopy (colonoscopy or flexible sigmoidoscopy):  Excessive amounts of blood in the stool  Significant tenderness or worsening of abdominal pains  Swelling of the abdomen that is new, acute  Fever of 100F or higher  For urgent or emergent issues, a gastroenterologist can be reached at any hour by calling [phone]. Do not use MyChart messaging for urgent concerns.    DIET:  We do recommend a small meal at first, but then you may proceed to your regular  diet.  Drink plenty of fluids but you should avoid alcoholic beverages for 24 hours.  ACTIVITY:  You should plan to take it easy for the rest of today and you should NOT DRIVE or use heavy machinery until tomorrow (because of the sedation medicines used during the test).    FOLLOW UP: Our staff will call the number listed on your records the next business day following your procedure.  We will call around 7:15- 8:00 am to check on you and address any questions or concerns that you may have regarding the information given to you following your procedure. If we do not reach you, we will leave a message.     If any biopsies were taken you will be contacted by phone or by letter within the next 1-3 weeks.  Please call us at [phone] if you have not heard about the biopsies in 3 weeks.    SIGNATURES/CONFIDENTIALITY: You and/or your care partner have signed paperwork which will be entered into your electronic medical record.  These signatures attest to the fact that that the information above on your After Visit Summary has been reviewed and is understood.  Full responsibility of the confidentiality of this discharge information lies with you and/or your care-partner.  USTED TUVO UN PROCEDIMIENTO ENDOSCPICO HOY EN EL [facility]:   Lea el informe del procedimiento que se le entreg para cualquier pregunta especfica sobre lo  que se Dentist.  Si el informe del examen no responde a sus preguntas, por favor llame a su gastroenterlogo para aclararlo.  Si usted solicit que no se le den [employer] de lo que se Clinical cytogeneticist en su procedimiento al [employer] va a cuidar, entonces el informe del procedimiento se ha incluido en un sobre sellado para que usted lo revise despus cuando le sea ms conveniente.   LO QUE PUEDE ESPERAR: Algunas sensaciones de hinchazn en el abdomen.  Puede tener ms gases de lo normal.  El caminar puede ayudarle a eliminar el aire que se le puso  en el tracto gastrointestinal durante el procedimiento y reducir la hinchazn.  Si le hicieron una endoscopia inferior (como una colonoscopia o una sigmoidoscopia flexible), podra notar manchas de sangre en las heces fecales o en el papel higinico.  Si se someti a una preparacin intestinal para su procedimiento, es posible que no tenga una evacuacin intestinal normal durante [employer].   Tenga en cuenta:  Es posible que note un poco de irritacin y congestin en la nariz o algn drenaje.  Esto es debido al oxgeno [employer] durante su procedimiento.  No hay que preocuparse y esto debe desaparecer ms o Regulatory affairs officer.   SNTOMAS PARA REPORTAR INMEDIATAMENTE:  Despus de una endoscopia inferior (colonoscopia o sigmoidoscopia flexible):  Cantidades excesivas de sangre en las heces fecales  Sensibilidad significativa o empeoramiento de los dolores abdominales   Hinchazn aguda del abdomen que antes no tena   Fiebre de 100F o ms   Para asuntos urgentes o de Associate Professor, puede comunicarse con un gastroenterlogo a cualquier hora llamando al [phone].  DIETA:  Recomendamos una comida pequea al principio, pero luego puede continuar con su dieta normal.  Tome muchos lquidos, Tax adviser las bebidas alcohlicas durante 24 horas.    ACTIVIDAD:  Debe planear tomarse las cosas con calma por el resto del da y no debe CONDUCIR ni usar maquinaria pesada Patent examiner (debido a los medicamentos de sedacin utilizados durante el examen).     SEGUIMIENTO: Nuestro personal llamar al nmero que aparece en su historial al siguiente da hbil de su procedimiento para ver cmo se siente y para responder cualquier pregunta o inquietud que pueda tener con respecto a la informacin que se le dio despus del procedimiento. Si no podemos contactarle, le dejaremos un mensaje.  Sin embargo, si se siente bien y no tiene English as a second language teacher, no es necesario que nos devuelva la llamada.  Asumiremos que ha  regresado a sus actividades diarias normales sin incidentes. Si se le tomaron algunas biopsias, le contactaremos por telfono o por carta en las prximas 3 semanas.  Si no ha sabido [employer] biopsias en el transcurso de 3 semanas, por favor llmenos al [phone].   FIRMAS/CONFIDENCIALIDAD: Usted y/o el acompaante que le cuide han firmado documentos que se ingresarn en su historial mdico electrnico.  Estas firmas atestiguan el hecho de que la informacin anterior

## 2023-05-25 NOTE — Progress Notes (Signed)
Pt's states no medical or surgical changes since previsit or office visit. 

## 2023-05-25 NOTE — Progress Notes (Signed)
[facility] History and Physical   Primary Care Physician:  Marrianne Mood, MD   Reason for Procedure:   CRCA screening  Plan:    colonoscopy     HPI: Phillip Wells is a 72 y.o. male for screening exam   Past Medical History:  Diagnosis Date   Coronary artery disease    s/p DES to the LAD   HTN (hypertension)    Hyperlipemia    Incomplete RBBB    Myocardial infarction (HCC) 11/09/2010   Pre-diabetes     Past Surgical History:  Procedure Laterality Date   APPENDECTOMY     CORONARY STENT PLACEMENT  10/12/2010   DES to LAD    Prior to Admission medications   Medication Sig Start Date End Date Taking? Authorizing Provider  amLODipine (NORVASC) 10 MG tablet Take 10 mg by mouth daily. 04/07/23  Yes [provider]  aspirin EC (ASPIRIN LOW DOSE) 81 MG tablet TAKE 1 Tablet BY MOUTH ONCE DAILY (DO NOT CRUSH OR CHEW. SWALLOW WHOLE) 08/08/22  Yes Marrianne Mood, MD  atorvastatin (LIPITOR) 40 MG tablet TAKE 1 Tablet BY MOUTH ONCE EVERY DAY 08/08/22  Yes Marrianne Mood, MD  hydrochlorothiazide (HYDRODIURIL) 12.5 MG tablet Take 1 tablet (12.5 mg total) by mouth daily. 10/10/22 10/05/23 Yes Willette Cluster, MD  lisinopril (ZESTRIL) 40 MG tablet Take 1 tablet (40 mg total) by mouth daily. 08/08/22  Yes Marrianne Mood, MD  metFORMIN (GLUCOPHAGE) 500 MG tablet Take 1 tablet (500 mg total) by mouth 2 (two) times daily with a meal. 08/08/22  Yes Marrianne Mood, MD  metoprolol tartrate (LOPRESSOR) 25 MG tablet Take 1 tablet (25 mg total) by mouth 2 (two) times daily. 08/08/22  Yes Marrianne Mood, MD  brimonidine (ALPHAGAN) 0.2 % ophthalmic solution Place 1 drop into both eyes 3 (three) times daily. Patient not taking: Reported on 05/04/2023 08/02/17   John Giovanni, MD  dorzolamide-timolol (COSOPT) 2-0.5 % ophthalmic solution Place 1 drop into both eyes 2 (two) times daily. Patient not taking: Reported on 05/04/2023 11/15/22      dorzolamide-timolol (COSOPT) 22.3-6.8 MG/ML ophthalmic solution Place 1 drop into both eyes 2 (two) times daily. Patient not taking: Reported on 05/25/2023 08/27/21     dorzolamide-timolol (COSOPT) 22.3-6.8 MG/ML ophthalmic solution Place 1 drop into both eyes 2 (two) times daily. Patient not taking: Reported on 05/25/2023 08/31/21     famotidine (PEPCID) 20 MG tablet Take 1 tablet (20 mg total) by mouth 2 (two) times daily as needed for heartburn or indigestion. Patient not taking: Reported on 05/04/2023 08/08/22 08/08/23  Marrianne Mood, MD  latanoprost (XALATAN) 0.005 % ophthalmic solution Place 1 drop into both eyes at bedtime. Patient not taking: Reported on 05/04/2023 08/02/17   John Giovanni, MD  latanoprost (XALATAN) 0.005 % ophthalmic solution Place 1 drop into the left eye  nightly Patient not taking: Reported on 05/04/2023 06/16/21     latanoprost (XALATAN) 0.005 % ophthalmic solution Place 1 drop into the left eye nightly Patient not taking: Reported on 05/04/2023 10/17/22     latanoprost (XALATAN) 0.005 % ophthalmic solution Place 1 drop into both eyes nightly Patient not taking: Reported on 05/04/2023 11/15/22     NITROSTAT 0.4 MG SL tablet PLACE 1 TABLET UNDER TONGUE AS NEEDED FOR CHEST PAIN EVERY 5 MINUTES X 3 MAX DOSES.  CALL 911 IF PAIN PERSISTS Patient not taking: Reported on 05/25/2023 02/27/18   Ginger Carne, MD  timolol (TIMOPTIC) 0.5 % ophthalmic solution INSTILL 1 DROP IN [facility] EYE TWICE  DAILY Patient not taking: Reported on 05/04/2023 05/21/18   Ginger Carne, MD    Current Outpatient Medications  Medication Sig Dispense Refill   amLODipine (NORVASC) 10 MG tablet Take 10 mg by mouth daily.     aspirin EC (ASPIRIN LOW DOSE) 81 MG tablet TAKE 1 Tablet BY MOUTH ONCE DAILY (DO NOT CRUSH OR CHEW. SWALLOW WHOLE) 90 tablet 3   atorvastatin (LIPITOR) 40 MG tablet TAKE 1 Tablet BY MOUTH ONCE EVERY DAY 90 tablet 3   hydrochlorothiazide (HYDRODIURIL) 12.5 MG tablet Take 1 tablet (12.5 mg  total) by mouth daily. 90 tablet 3   lisinopril (ZESTRIL) 40 MG tablet Take 1 tablet (40 mg total) by mouth daily. 90 tablet 3   metFORMIN (GLUCOPHAGE) 500 MG tablet Take 1 tablet (500 mg total) by mouth 2 (two) times daily with a meal. 180 tablet 3   metoprolol tartrate (LOPRESSOR) 25 MG tablet Take 1 tablet (25 mg total) by mouth 2 (two) times daily. 180 tablet 3   brimonidine (ALPHAGAN) 0.2 % ophthalmic solution Place 1 drop into both eyes 3 (three) times daily. (Patient not taking: Reported on 05/04/2023) 5 mL 0   dorzolamide-timolol (COSOPT) 2-0.5 % ophthalmic solution Place 1 drop into both eyes 2 (two) times daily. (Patient not taking: Reported on 05/04/2023) 10 mL 12   dorzolamide-timolol (COSOPT) 22.3-6.8 MG/ML ophthalmic solution Place 1 drop into both eyes 2 (two) times daily. (Patient not taking: Reported on 05/25/2023) 10 mL 11   dorzolamide-timolol (COSOPT) 22.3-6.8 MG/ML ophthalmic solution Place 1 drop into both eyes 2 (two) times daily. (Patient not taking: Reported on 05/25/2023) 10 mL 11   famotidine (PEPCID) 20 MG tablet Take 1 tablet (20 mg total) by mouth 2 (two) times daily as needed for heartburn or indigestion. (Patient not taking: Reported on 05/04/2023) 180 tablet 2   latanoprost (XALATAN) 0.005 % ophthalmic solution Place 1 drop into both eyes at bedtime. (Patient not taking: Reported on 05/04/2023) 2.5 mL 0   latanoprost (XALATAN) 0.005 % ophthalmic solution Place 1 drop into the left eye  nightly (Patient not taking: Reported on 05/04/2023) 2.5 mL 6   latanoprost (XALATAN) 0.005 % ophthalmic solution Place 1 drop into the left eye nightly (Patient not taking: Reported on 05/04/2023) 2.5 mL 12   latanoprost (XALATAN) 0.005 % ophthalmic solution Place 1 drop into both eyes nightly (Patient not taking: Reported on 05/04/2023) 2.5 mL 12   NITROSTAT 0.4 MG SL tablet PLACE 1 TABLET UNDER TONGUE AS NEEDED FOR CHEST PAIN EVERY 5 MINUTES X 3 MAX DOSES.  CALL 911 IF PAIN PERSISTS (Patient not  taking: Reported on 05/25/2023) 25 tablet 3   timolol (TIMOPTIC) 0.5 % ophthalmic solution INSTILL 1 DROP IN EACH EYE TWICE DAILY (Patient not taking: Reported on 05/04/2023) 10 mL 1   Current Facility-Administered Medications  Medication Dose Route Frequency Provider Last Rate Last Admin   0.9 %  sodium chloride infusion  500 mL Intravenous Continuous Iva Boop, MD        Allergies as of 05/25/2023   (No Known Allergies)    Family History  Problem Relation Age of Onset   Heart disease Neg Hx    Colon cancer Neg Hx    Rectal cancer Neg Hx    Stomach cancer Neg Hx     Social History   Socioeconomic History   Marital status: Married    Spouse name: Not on file   Number of children: Not on file   Years of education:  3   Highest education level: Not on file  Occupational History   Occupation: CONSTRUCTION    Employer: [employer]  Tobacco Use   Smoking status: Former    Types: Cigarettes    Quit date: 01/31/1981    Years since quitting: 42.3   Smokeless tobacco: Never  Vaping Use   Vaping Use: Never used  Substance and Sexual Activity   Alcohol use: No    Alcohol/week: 0.0 standard drinks of alcohol   Drug use: No   Sexual activity: Not on file  Other Topics Concern   Not on file  Social History Narrative   Not on file   Social Determinants of Health   Financial Resource Strain: Not on file  Food Insecurity: Not on file  Transportation Needs: Not on file  Physical Activity: Not on file  Stress: Not on file  Social Connections: Not on file  Intimate Partner Violence: Not on file    Review of Systems:  All other review of systems negative except as mentioned in the HPI.  Physical Exam: Vital signs BP (!) 158/74   Pulse (!) 51   Temp 98.2 F (36.8 C)   Ht 5\' 6"  (1.676 m)   Wt 190 lb 9.6 oz (86.5 kg)   BMI 30.76 kg/m   General:   Alert,  Well-developed, well-nourished, pleasant and cooperative in NAD Lungs:  Clear throughout to auscultation.    Heart:  Regular rate and rhythm; no murmurs, clicks, rubs,  or gallops. Abdomen:  Soft, nontender and nondistended. Normal bowel sounds.   Neuro/Psych:  Alert and cooperative. Normal mood and affect. A and O x 3   @Aviona Martenson  Sena Slate, MD, [facility] [phone] (pager) 05/25/2023 7:59 AM@

## 2023-05-26 ENCOUNTER — Telehealth: Payer: Self-pay | Admitting: *Deleted

## 2023-05-26 NOTE — Telephone Encounter (Signed)
Post procedure follow up call placed, no answer and no VM.  

## 2023-06-08 ENCOUNTER — Encounter: Payer: Self-pay | Admitting: Internal Medicine

## 2023-06-08 DIAGNOSIS — Z860101 Personal history of adenomatous and serrated colon polyps: Secondary | ICD-10-CM

## 2023-06-08 DIAGNOSIS — Z86010 Personal history of colonic polyps: Secondary | ICD-10-CM | POA: Insufficient documentation

## 2023-06-08 HISTORY — DX: Personal history of adenomatous and serrated colon polyps: Z86.0101

## 2023-07-19 ENCOUNTER — Other Ambulatory Visit (HOSPITAL_COMMUNITY): Payer: Self-pay

## 2023-07-19 MED ORDER — DORZOLAMIDE HCL-TIMOLOL MAL 2-0.5 % OP SOLN
1.0000 [drp] | Freq: Two times a day (BID) | OPHTHALMIC | 12 refills | Status: DC
  Filled 2023-07-19: qty 10, 50d supply, fill #0

## 2023-07-19 MED ORDER — LATANOPROST 0.005 % OP SOLN
1.0000 [drp] | Freq: Every evening | OPHTHALMIC | 12 refills | Status: AC
  Filled 2023-07-19: qty 2.5, 25d supply, fill #0
  Filled 2024-01-22: qty 5, 50d supply, fill #0
  Filled 2024-03-25: qty 5, 50d supply, fill #1

## 2023-07-31 ENCOUNTER — Other Ambulatory Visit (HOSPITAL_COMMUNITY): Payer: Self-pay

## 2023-08-01 ENCOUNTER — Other Ambulatory Visit (HOSPITAL_COMMUNITY): Payer: Self-pay

## 2023-09-17 ENCOUNTER — Emergency Department (HOSPITAL_COMMUNITY)
Admission: EM | Admit: 2023-09-17 | Discharge: 2023-09-18 | Disposition: A | Discharge status: Home / Self Care | Payer: Self-pay | Attending: Emergency Medicine | Admitting: Emergency Medicine

## 2023-09-17 ENCOUNTER — Other Ambulatory Visit: Payer: Self-pay

## 2023-09-17 DIAGNOSIS — R079 Chest pain, unspecified: Secondary | ICD-10-CM

## 2023-09-17 DIAGNOSIS — Z7982 Long term (current) use of aspirin: Secondary | ICD-10-CM | POA: Insufficient documentation

## 2023-09-17 DIAGNOSIS — R0789 Other chest pain: Secondary | ICD-10-CM | POA: Insufficient documentation

## 2023-09-18 ENCOUNTER — Other Ambulatory Visit: Payer: Self-pay

## 2023-09-18 ENCOUNTER — Emergency Department (HOSPITAL_COMMUNITY): Payer: Self-pay

## 2023-09-18 LAB — CBC
HCT: 41.9 % (ref 39.0–52.0)
Hemoglobin: 14 g/dL (ref 13.0–17.0)
MCH: 30.8 pg (ref 26.0–34.0)
MCHC: 33.4 g/dL (ref 30.0–36.0)
MCV: 92.1 fL (ref 80.0–100.0)
Platelets: 222 10*3/uL (ref 150–400)
RBC: 4.55 MIL/uL (ref 4.22–5.81)
RDW: 14.3 % (ref 11.5–15.5)
WBC: 6.9 10*3/uL (ref 4.0–10.5)
nRBC: 0 % (ref 0.0–0.2)

## 2023-09-18 LAB — BASIC METABOLIC PANEL
Anion gap: 13 (ref 5–15)
BUN: 20 mg/dL (ref 8–23)
CO2: 21 mmol/L — ABNORMAL LOW (ref 22–32)
Calcium: 8.5 mg/dL — ABNORMAL LOW (ref 8.9–10.3)
Chloride: 100 mmol/L (ref 98–111)
Creatinine, Ser: 1.17 mg/dL (ref 0.61–1.24)
GFR, Estimated: >60 mL/min (ref >60)
Glucose, Bld: 183 mg/dL — ABNORMAL HIGH (ref 70–99)
Potassium: 3.7 mmol/L (ref 3.5–5.1)
Sodium: 134 mmol/L — ABNORMAL LOW (ref 135–145)

## 2023-09-18 LAB — TROPONIN I (HIGH SENSITIVITY): Troponin I (High Sensitivity): 8 ng/L (ref <18)

## 2023-09-18 NOTE — ED Triage Notes (Signed)
The pt is c/o chest pain tonight no sob or dizziness just heaviness  hx of mi

## 2023-09-18 NOTE — Discharge Instructions (Addendum)
Your test today suggest that this is not a heart attack.  No test is perfect.  If your symptoms worsen or if you realize that they are worse when you are going up stairs or exercising then please come immediately back.  I have placed a order for the cardiologist to call you on the phone to try and set up an appointment.  La prueba que le hicieron hoy indica que no se trata de un ataque cardaco. Ninguna prueba es perfecta. Si sus sntomas empeoran o si se da cuenta de que empeoran cuando sube escaleras o hace ejercicio, regrese de inmediato. He pedido al cardilogo que lo llame por telfono para intentar concertar una cita.

## 2023-09-18 NOTE — ED Provider Notes (Signed)
[place] EMERGENCY DEPARTMENT AT [facility] Provider Note   CSN: [id number] Arrival date & time: 09/17/23  2343     History  Chief Complaint  Patient presents with   Chest Pain    Phillip Wells is a 72 y.o. male.  69 yoM with a chief complaint of left-sided chest discomfort.  This been going on for about 12 hours or so.  Seems to come and go.  Nothing seems to make it happen.  Has not had any significant change in the past 6 hours.  Has no pain currently.  No pain going up stairs or walking on the street.  Denies trauma to the chest.  Denies cough congestion or fever.  Denies diaphoresis denies shortness of breath with it.  He does have a history of an MI.  He said he was having a surgical procedure when it was noted. Ended up having a pending a stent placed.   Chest Pain      Home Medications Prior to Admission medications   Medication Sig Start Date End Date Taking? Authorizing Provider  amLODipine (NORVASC) 10 MG tablet Take 10 mg by mouth daily. 04/07/23   [provider]  aspirin EC (ASPIRIN LOW DOSE) 81 MG tablet TAKE 1 Tablet BY MOUTH ONCE DAILY (DO NOT CRUSH OR CHEW. SWALLOW WHOLE) 08/08/22   Marrianne Mood, MD  atorvastatin (LIPITOR) 40 MG tablet TAKE 1 Tablet BY MOUTH ONCE EVERY DAY 08/08/22   Marrianne Mood, MD  brimonidine (ALPHAGAN) 0.2 % ophthalmic solution Place 1 drop into both eyes 3 (three) times daily. Patient not taking: Reported on 05/04/2023 08/02/17   John Giovanni, MD  dorzolamide-timolol (COSOPT) 2-0.5 % ophthalmic solution Place 1 drop into both eyes 2 (two) times daily. Patient not taking: Reported on 05/04/2023 11/15/22     dorzolamide-timolol (COSOPT) 2-0.5 % ophthalmic solution Place 1 drop into both eyes 2 (two) times daily. 07/18/23     dorzolamide-timolol (COSOPT) 22.3-6.8 MG/ML ophthalmic solution Place 1 drop into both eyes 2 (two) times daily. Patient not taking: Reported on 05/25/2023 08/27/21      dorzolamide-timolol (COSOPT) 22.3-6.8 MG/ML ophthalmic solution Place 1 drop into both eyes 2 (two) times daily. Patient not taking: Reported on 05/25/2023 08/31/21     famotidine (PEPCID) 20 MG tablet Take 1 tablet (20 mg total) by mouth 2 (two) times daily as needed for heartburn or indigestion. Patient not taking: Reported on 05/04/2023 08/08/22 08/08/23  Marrianne Mood, MD  hydrochlorothiazide (HYDRODIURIL) 12.5 MG tablet Take 1 tablet (12.5 mg total) by mouth daily. 10/10/22 10/05/23  Willette Cluster, MD  latanoprost (XALATAN) 0.005 % ophthalmic solution Place 1 drop into both eyes at bedtime. Patient not taking: Reported on 05/04/2023 08/02/17   John Giovanni, MD  latanoprost (XALATAN) 0.005 % ophthalmic solution Place 1 drop into the left eye  nightly Patient not taking: Reported on 05/04/2023 06/16/21     latanoprost (XALATAN) 0.005 % ophthalmic solution Place 1 drop into the left eye nightly Patient not taking: Reported on 05/04/2023 10/17/22     latanoprost (XALATAN) 0.005 % ophthalmic solution Place 1 drop into both eyes nightly Patient not taking: Reported on 05/04/2023 11/15/22     latanoprost (XALATAN) 0.005 % ophthalmic solution Place 1 drop into both eyes nightly. 07/18/23     lisinopril (ZESTRIL) 40 MG tablet Take 1 tablet (40 mg total) by mouth daily. 08/08/22   Marrianne Mood, MD  metFORMIN (GLUCOPHAGE) 500 MG tablet Take 1 tablet (500 mg total) by mouth 2 (  two) times daily with a meal. 08/08/22   Marrianne Mood, MD  metoprolol tartrate (LOPRESSOR) 25 MG tablet Take 1 tablet (25 mg total) by mouth 2 (two) times daily. 08/08/22   Marrianne Mood, MD  NITROSTAT 0.4 MG SL tablet PLACE 1 TABLET UNDER TONGUE AS NEEDED FOR CHEST PAIN EVERY 5 MINUTES X 3 MAX DOSES.  CALL 911 IF PAIN PERSISTS Patient not taking: Reported on 05/25/2023 02/27/18   Ginger Carne, MD  timolol (TIMOPTIC) 0.5 % ophthalmic solution INSTILL 1 DROP IN [facility] EYE TWICE DAILY Patient not taking: Reported on 05/04/2023  05/21/18   Ginger Carne, MD      Allergies    Patient has no known allergies.    Review of Systems   Review of Systems  Cardiovascular:  Positive for chest pain.    Physical Exam Updated Vital Signs BP (!) 155/80 (BP Location: Left Arm)   Pulse (!) 55   Temp 98.3 F (36.8 C) (Oral)   Resp 17   Ht 5\' 6"  (1.676 m)   Wt 86.5 kg   SpO2 99%   BMI 30.78 kg/m  Physical Exam Vitals and nursing note reviewed.  Constitutional:      Appearance: He is well-developed.  HENT:     Head: Normocephalic and atraumatic.  Eyes:     Pupils: Pupils are equal, round, and reactive to light.  Neck:     Vascular: No JVD.  Cardiovascular:     Rate and Rhythm: Normal rate and regular rhythm.     Heart sounds: No murmur heard.    No friction rub. No gallop.  Pulmonary:     Effort: No respiratory distress.     Breath sounds: No wheezing.  Abdominal:     General: There is no distension.     Tenderness: There is no abdominal tenderness. There is no guarding or rebound.  Musculoskeletal:        General: Normal range of motion.     Cervical back: Normal range of motion and neck supple.  Skin:    Coloration: Skin is not pale.     Findings: No rash.  Neurological:     Mental Status: He is alert and oriented to person, place, and time.  Psychiatric:        Behavior: Behavior normal.     ED Results / Procedures / Treatments   Labs (all labs ordered are listed, but only abnormal results are displayed) Labs Reviewed  BASIC METABOLIC PANEL - Abnormal; Notable for the following components:      Result Value   Sodium 134 (*)    CO2 21 (*)    Glucose, Bld 183 (*)    Calcium 8.5 (*)    All other components within normal limits  CBC  TROPONIN I (HIGH SENSITIVITY)  TROPONIN I (HIGH SENSITIVITY)    EKG None  Radiology No results found.  Procedures Procedures    Medications Ordered in ED Medications - No data to display  ED Course/ Medical Decision Making/ A&P                                  Medical Decision Making  72 yo M with a chief complaints of left-sided chest discomfort.  This has been off and on today.  Not exertional.  He has a troponin that is negative.  No anemia, no significant electrolyte abnormalities.  Chest x-ray independently interpreted by me without focal infiltrate  or pneumothorax.  Patient did have an MI in 2011.  When I reviewed the records he was having intermittent chest pain usually with exertion.  Patient does not seem to remember this or what it felt like.  Cannot tell me if this pain today feels similar.  I did offer to admit the patient overnight for serial troponins which he is currently declining.  He has had pain for greater than 6 hours without significant change do not feel delta would be beneficial.  Will discharge the patient home.  Cardiology follow-up.  2:02 AM:  I have discussed the diagnosis/risks/treatment options with the patient and family.  Evaluation and diagnostic testing in the emergency department does not suggest an emergent condition requiring admission or immediate intervention beyond what has been performed at this time.  They will follow up with PCP. We also discussed returning to the ED immediately if new or worsening sx occur. We discussed the sx which are most concerning (e.g., sudden worsening pain, fever, inability to tolerate by mouth) that necessitate immediate return. Medications administered to the patient during their visit and any new prescriptions provided to the patient are listed below.  Medications given during this visit Medications - No data to display   The patient appears reasonably screen and/or stabilized for discharge and I doubt any other medical condition or other [facility] requiring further screening, evaluation, or treatment in the ED at this time prior to discharge.          Final Clinical Impression(s) / ED Diagnoses Final diagnoses:  Nonspecific chest pain    Rx / DC Orders ED  Discharge Orders          Ordered    Ambulatory referral to Cardiology        09/18/23 0159              Melene Plan, DO 09/18/23 0202

## 2023-09-19 ENCOUNTER — Other Ambulatory Visit (HOSPITAL_COMMUNITY): Payer: Self-pay

## 2023-09-29 ENCOUNTER — Other Ambulatory Visit: Payer: Self-pay | Admitting: Student

## 2023-09-29 DIAGNOSIS — E119 Type 2 diabetes mellitus without complications: Secondary | ICD-10-CM

## 2023-09-29 DIAGNOSIS — I2510 Atherosclerotic heart disease of native coronary artery without angina pectoris: Secondary | ICD-10-CM

## 2023-09-29 DIAGNOSIS — I10 Essential (primary) hypertension: Secondary | ICD-10-CM

## 2023-10-02 ENCOUNTER — Other Ambulatory Visit (HOSPITAL_COMMUNITY): Payer: Self-pay

## 2023-10-02 NOTE — Telephone Encounter (Signed)
LOV - 10/10/22. No show x 2. I called pt's daughter (contact person) Eulah Citizen - stated she can make an appt for pt. Call transferred to front office. Appt schedule with Dr Carlynn Purl on 10/17/23.

## 2023-10-17 ENCOUNTER — Encounter: Payer: Self-pay | Admitting: Internal Medicine

## 2023-10-24 ENCOUNTER — Ambulatory Visit (INDEPENDENT_AMBULATORY_CARE_PROVIDER_SITE_OTHER): Payer: Self-pay | Admitting: Internal Medicine

## 2023-10-24 ENCOUNTER — Encounter: Payer: Self-pay | Admitting: Internal Medicine

## 2023-10-24 VITALS — BP 137/72 | HR 56 | Temp 98.1°F | Ht 66.0 in | Wt 189.7 lb

## 2023-10-24 DIAGNOSIS — R079 Chest pain, unspecified: Secondary | ICD-10-CM | POA: Insufficient documentation

## 2023-10-24 DIAGNOSIS — E119 Type 2 diabetes mellitus without complications: Secondary | ICD-10-CM

## 2023-10-24 DIAGNOSIS — I2510 Atherosclerotic heart disease of native coronary artery without angina pectoris: Secondary | ICD-10-CM

## 2023-10-24 DIAGNOSIS — I10 Essential (primary) hypertension: Secondary | ICD-10-CM

## 2023-10-24 DIAGNOSIS — K219 Gastro-esophageal reflux disease without esophagitis: Secondary | ICD-10-CM

## 2023-10-24 DIAGNOSIS — Z7984 Long term (current) use of oral hypoglycemic drugs: Secondary | ICD-10-CM

## 2023-10-24 DIAGNOSIS — H409 Unspecified glaucoma: Secondary | ICD-10-CM

## 2023-10-24 LAB — GLUCOSE, CAPILLARY: Glucose-Capillary: 119 mg/dL — ABNORMAL HIGH (ref 70–99)

## 2023-10-24 LAB — POCT GLYCOSYLATED HEMOGLOBIN (HGB A1C): Hemoglobin A1C: 7.5 % — AB (ref 4.0–5.6)

## 2023-10-24 MED ORDER — METFORMIN HCL 500 MG PO TABS: 1000.0000 mg | ORAL_TABLET | Freq: Two times a day (BID) | ORAL | 3 refills | Status: DC

## 2023-10-24 MED ORDER — LATANOPROST 0.005 % OP SOLN: 1.0000 [drp] | Freq: Every day | OPHTHALMIC | Status: AC

## 2023-10-24 MED ORDER — AMLODIPINE BESYLATE 10 MG PO TABS
10.0000 mg | ORAL_TABLET | Freq: Every day | ORAL | 3 refills | Status: DC
Start: 2023-10-24 — End: 2024-08-15

## 2023-10-24 MED ORDER — FAMOTIDINE 20 MG PO TABS: 20.0000 mg | ORAL_TABLET | Freq: Two times a day (BID) | ORAL | 2 refills | Status: DC | PRN

## 2023-10-24 MED ORDER — LISINOPRIL 40 MG PO TABS
40.0000 mg | ORAL_TABLET | Freq: Every day | ORAL | 3 refills | Status: DC
Start: 2023-10-24 — End: 2024-08-15

## 2023-10-24 MED ORDER — DORZOLAMIDE HCL-TIMOLOL MAL 2-0.5 % OP SOLN: 1.0000 [drp] | Freq: Two times a day (BID) | OPHTHALMIC | 12 refills | Status: AC

## 2023-10-24 MED ORDER — ATORVASTATIN CALCIUM 40 MG PO TABS: ORAL_TABLET | ORAL | 3 refills | Status: DC

## 2023-10-24 MED ORDER — METOPROLOL TARTRATE 25 MG PO TABS
25.0000 mg | ORAL_TABLET | Freq: Two times a day (BID) | ORAL | 3 refills | Status: DC
Start: 2023-10-24 — End: 2024-08-15

## 2023-10-24 MED ORDER — ASPIRIN 81 MG PO TBEC
DELAYED_RELEASE_TABLET | ORAL | 3 refills | Status: DC
Start: 2023-10-24 — End: 2024-08-15

## 2023-10-24 MED ORDER — HYDROCHLOROTHIAZIDE 12.5 MG PO TABS
12.5000 mg | ORAL_TABLET | Freq: Every day | ORAL | 3 refills | Status: DC
Start: 2023-10-24 — End: 2024-08-15

## 2023-10-24 NOTE — Assessment & Plan Note (Signed)
Patient had a recent ED visit for chest pain. His pain was left-sided, nonexertional, waxing and waning. Workup was negative and patient was discharged. Patient has not had further episodes of pain. Hx significant for CAD, TIA, periprocedural MI. Given patient's heart history, I have low threshold to refer patient to cardiology if symptoms reoccur. As patient is currently asymptomatic and does not have a strong desire to see cardiology, I will defer referring at this time.

## 2023-10-24 NOTE — Progress Notes (Signed)
    Subjective:  CC: ED follow up, DM, HTN  HPI:  [name] is a 72 y.o. male with a past medical history stated below and presents today for above. Please see problem based assessment and plan for additional details.  Past Medical History:  Diagnosis Date   Coronary artery disease    s/p DES to the LAD   HTN (hypertension)    Hx of adenomatous colonic polyps 06/08/2023   Hyperlipemia    Incomplete RBBB    Myocardial infarction (HCC) 11/09/2010   Pre-diabetes     Current Outpatient Medications on File Prior to Visit  Medication Sig Dispense Refill   brimonidine (ALPHAGAN) 0.2 % ophthalmic solution Place 1 drop into both eyes 3 (three) times daily. (Patient not taking: Reported on 05/04/2023) 5 mL 0   latanoprost (XALATAN) 0.005 % ophthalmic solution Place 1 drop into both eyes nightly. 2.5 mL 12   NITROSTAT 0.4 MG SL tablet PLACE 1 TABLET UNDER TONGUE AS NEEDED FOR CHEST PAIN EVERY 5 MINUTES X 3 MAX DOSES.  CALL 911 IF PAIN PERSISTS (Patient not taking: Reported on 05/25/2023) 25 tablet 3   timolol (TIMOPTIC) 0.5 % ophthalmic solution INSTILL 1 DROP IN [facility] EYE TWICE DAILY (Patient not taking: Reported on 05/04/2023) 10 mL 1   No current facility-administered medications on file prior to visit.    Review of Systems: ROS negative except for as is noted on the assessment and plan.  Objective:   Vitals:   10/24/23 1455  BP: 137/72  Pulse: (!) 56  Temp: 98.1 F (36.7 C)  SpO2: 98%  Weight: 189 lb 11.2 oz (86 kg)  Height: 5\' 6"  (1.676 m)    Physical Exam: Constitutional: well-appearing, in no acute distress HENT: normocephalic atraumatic, mucous membranes moist Cardiovascular: regular rate and rhythm, no m/r/g Pulmonary/Chest: normal work of breathing on room air, lungs clear to auscultation bilaterally Abdominal: soft, non-tender, non-distended MSK: normal bulk and tone Neurological: alert & oriented x 3, 5/5 strength in bilateral upper and lower  extremities Skin: warm and dry  Assessment & Plan:   Essential hypertension BP today is 137/72. Continue current regimen of amlodipine 10mg , hydrochlorothiazide 12.5mg , lisinopril 40mg , metoprolol 25mg  BID  Type 2 diabetes mellitus (HCC) A1c today is 7.5 from 7.0 at last visit. Patient takes metformin 500mg  daily, has no GI side effects.  -Increase metformin to 1000mg  daily -Urine MCR today -3 month follow up for A1c check  Chest pain Patient had a recent ED visit for chest pain. His pain was left-sided, nonexertional, waxing and waning. Workup was negative and patient was discharged. Patient has not had further episodes of pain. Hx significant for CAD, TIA, periprocedural MI. Given patient's heart history, I have low threshold to refer patient to cardiology if symptoms reoccur. As patient is currently asymptomatic and does not have a strong desire to see cardiology, I will defer referring at this time.     Patient seen with Dr. Bari Edward MD [facility]  PGY-1 Pager: [phone] Date 10/24/2023  Time 3:50 PM

## 2023-10-24 NOTE — Assessment & Plan Note (Addendum)
A1c today is 7.5 from 7.0 at last visit. Patient takes metformin 500mg  daily, has no GI side effects.  -Increase metformin to 1000mg  daily -Urine MCR today -3 month follow up for A1c check

## 2023-10-24 NOTE — Patient Instructions (Addendum)
Seor Dineen Kid,   Fue un placer conocerte hoy.   Para su diabetes, aumente su dosis de metformina a 1000 mg (dos pastillas) por Futures trader.   Le envo resurtidos de todos sus medicamentos.   Llame a nuestra oficina si vuelve a Technical sales engineer. No creo que necesitemos una referencia de cardiologa ahora, pero deberan verlo si vuelve a Technical sales engineer.   Gracias,  Dr. Brien Mates

## 2023-10-24 NOTE — Assessment & Plan Note (Signed)
BP today is 137/72. Continue current regimen of amlodipine 10mg , hydrochlorothiazide 12.5mg , lisinopril 40mg , metoprolol 25mg  BID

## 2023-10-25 LAB — MICROALBUMIN / CREATININE URINE RATIO
Creatinine, Urine: 105.3 mg/dL
Microalb/Creat Ratio: 7 mg/g{creat} (ref 0–29)
Microalbumin, Urine: 7.5 ug/mL

## 2023-10-26 NOTE — Progress Notes (Signed)
Internal Medicine Clinic Attending  I was physically present during the key portions of the resident provided service and participated in the medical decision making of patient's management care. I reviewed pertinent patient test results.  The assessment, diagnosis, and plan were formulated together and I agree with the documentation in the resident's note.  Agree with plan for cardiology.  However, he was given strict return precautions for chest pain.  We will keep a close eye to ensure he is not developing worsening angina or unstable angina.  Patient expressed understanding.  He will also need good BP and DM control going forward as risk modification.   Inez Catalina, MD

## 2024-01-22 ENCOUNTER — Other Ambulatory Visit (HOSPITAL_COMMUNITY): Payer: Self-pay

## 2024-01-24 ENCOUNTER — Other Ambulatory Visit (HOSPITAL_COMMUNITY): Payer: Self-pay

## 2024-01-24 ENCOUNTER — Ambulatory Visit: Payer: Self-pay | Admitting: Internal Medicine

## 2024-01-24 VITALS — BP 135/78 | HR 61 | Ht 66.0 in | Wt 189.2 lb

## 2024-01-24 DIAGNOSIS — E119 Type 2 diabetes mellitus without complications: Secondary | ICD-10-CM

## 2024-01-24 DIAGNOSIS — I10 Essential (primary) hypertension: Secondary | ICD-10-CM

## 2024-01-24 DIAGNOSIS — Z7984 Long term (current) use of oral hypoglycemic drugs: Secondary | ICD-10-CM

## 2024-01-24 DIAGNOSIS — Z0000 Encounter for general adult medical examination without abnormal findings: Secondary | ICD-10-CM

## 2024-01-24 LAB — POCT GLYCOSYLATED HEMOGLOBIN (HGB A1C): Hemoglobin A1C: 7.2 % — AB (ref 4.0–5.6)

## 2024-01-24 LAB — GLUCOSE, CAPILLARY: Glucose-Capillary: 132 mg/dL — ABNORMAL HIGH (ref 70–99)

## 2024-01-24 NOTE — Progress Notes (Unsigned)
CC: diabetes and hypertension  HPI:  [name] is a 73 y.o. male living with a history stated below and presents today for a 3 month follow up of his diabetes and hypertension. Encounter conducted with assistance of iPad interpretor. His daughter was present during the visit. Please see problem based assessment and plan for additional details.  Past Medical History:  Diagnosis Date   Coronary artery disease    s/p DES to the LAD   HTN (hypertension)    Hx of adenomatous colonic polyps 06/08/2023   Hyperlipemia    Incomplete RBBB    Myocardial infarction (HCC) 11/09/2010   Pre-diabetes     Current Outpatient Medications on File Prior to Visit  Medication Sig Dispense Refill   amLODipine (NORVASC) 10 MG tablet Take 1 tablet (10 mg total) by mouth daily. 90 tablet 3   aspirin EC (ASPIRIN LOW DOSE) 81 MG tablet TAKE 1 Tablet BY MOUTH ONCE EVERY DAY 90 tablet 3   atorvastatin (LIPITOR) 40 MG tablet TAKE 1 Tablet BY MOUTH ONCE EVERY DAY 90 tablet 3   brimonidine (ALPHAGAN) 0.2 % ophthalmic solution Place 1 drop into both eyes 3 (three) times daily. (Patient not taking: Reported on 05/04/2023) 5 mL 0   dorzolamide-timolol (COSOPT) 2-0.5 % ophthalmic solution Place 1 drop into both eyes 2 (two) times daily. 10 mL 12   famotidine (PEPCID) 20 MG tablet Take 1 tablet (20 mg total) by mouth 2 (two) times daily as needed for heartburn or indigestion. 180 tablet 2   hydrochlorothiazide (HYDRODIURIL) 12.5 MG tablet Take 1 tablet (12.5 mg total) by mouth daily. 90 tablet 3   latanoprost (XALATAN) 0.005 % ophthalmic solution Place 1 drop into both eyes nightly. 2.5 mL 12   latanoprost (XALATAN) 0.005 % ophthalmic solution Place 1 drop into both eyes at bedtime.     lisinopril (ZESTRIL) 40 MG tablet Take 1 tablet (40 mg total) by mouth daily. 90 tablet 3   metFORMIN (GLUCOPHAGE) 500 MG tablet Take 2 tablets (1,000 mg total) by mouth 2 (two) times daily with a meal. 180 tablet 3    metoprolol tartrate (LOPRESSOR) 25 MG tablet Take 1 tablet (25 mg total) by mouth 2 (two) times daily. 180 tablet 3   NITROSTAT 0.4 MG SL tablet PLACE 1 TABLET UNDER TONGUE AS NEEDED FOR CHEST PAIN EVERY 5 MINUTES X 3 MAX DOSES.  CALL 911 IF PAIN PERSISTS (Patient not taking: Reported on 05/25/2023) 25 tablet 3   timolol (TIMOPTIC) 0.5 % ophthalmic solution INSTILL 1 DROP IN [facility] EYE TWICE DAILY (Patient not taking: Reported on 05/04/2023) 10 mL 1   No current facility-administered medications on file prior to visit.    Family History  Problem Relation Age of Onset   Heart disease Neg Hx    Colon cancer Neg Hx    Rectal cancer Neg Hx    Stomach cancer Neg Hx     Social History   Socioeconomic History   Marital status: Married    Spouse name: Not on file   Number of children: Not on file   Years of education: 3   Highest education level: Not on file  Occupational History   Occupation: CONSTRUCTION    Employer: [employer]  Tobacco Use   Smoking status: Former    Current packs/day: 0.00    Types: Cigarettes    Quit date: 01/31/1981    Years since quitting: 43.0   Smokeless tobacco: Never  Vaping Use   Vaping status:  Never Used  Substance and Sexual Activity   Alcohol use: No    Alcohol/week: 0.0 standard drinks of alcohol   Drug use: No   Sexual activity: Not on file  Other Topics Concern   Not on file  Social History Narrative   Not on file   Social Drivers of Health   Financial Resource Strain: Not on file  Food Insecurity: Not on file  Transportation Needs: Not on file  Physical Activity: Not on file  Stress: Not on file  Social Connections: Not on file  Intimate Partner Violence: Not on file    Review of Systems: ROS negative except for what is noted on the assessment and plan.  Vitals:   01/24/24 1451 01/24/24 1508  BP: (!) 140/73 135/78  Pulse: 61 61  SpO2: 100%   Weight: 189 lb 3.2 oz (85.8 kg)   Height: 5\' 6"  (1.676 m)     Physical  Exam: Constitutional: well-appearing elderly male sitting in chair, in no acute distress Cardiovascular: regular rate and rhythm Pulmonary/Chest: normal work of breathing on room air, lungs clear to auscultation bilaterally Neurological: alert & oriented x 3, no focal deficit Skin: warm and dry Psych: normal mood and behavior  Assessment & Plan:    Patient discussed with Dr. Mayford Knife  Type 2 diabetes mellitus (HCC) A1c has improved from 7.5% to 7.2% today.  The patient is on metformin 1 g twice daily.   Plan: -Continue metformin 1G twice daily - Up-to-date with ophthalmology visit - Foot exam performed today  Essential hypertension Blood pressure initially 140/73, but improved to 135/78 upon recheck.  Continue current regimen of amlodipine 10 mg daily, HCTZ 12.5 mg daily, lisinopril 40 mg daily, metoprolol 25 mg twice daily.    Healthcare maintenance - Received flu shot in December - Reminded to renew financial assistance paperwork (expires in a few days)   Elza Rafter, D.O. [facility], PGY-3 Phone: [phone] Date 01/25/2024 Time 7:07 AM

## 2024-01-24 NOTE — Patient Instructions (Addendum)
Gracias, Phillip Wells por permitirnos brindarle atencin hoy. hoy discutimos;  Diabetes: tome metformina dos veces al da  Presin arterial alta: siga tomando lisinopril, amlodipino e hidroclorotiazida una vez al da. Tome metoprolol dos veces al da  Recuerde renovar sus trmites de asistencia financiera  He ordenado los siguientes laboratorios para usted:   Lab Orders         Glucose, capillary         POC Hbg A1C      Pruebas ordenadas hoy:  Referencias ordenadas hoy:  Referral Orders  No referral(s) requested today     He ordenado el siguiente medicamento/cambiado los siguientes medicamentos:  Suspender los siguientes medicamentos: There are no discontinued medications.   Iniciar los siguientes medicamentos: No orders of the defined types were placed in this encounter.    Seguimiento 3 months     Si tiene [place] pregunta o inquietud, llame a la clnica de medicina interna al [phone].     Elza Rafter, D.O. [facility]

## 2024-01-25 NOTE — Assessment & Plan Note (Signed)
A1c has improved from 7.5% to 7.2% today.  The patient is on metformin 1 g twice daily.   Plan: -Continue metformin 1G twice daily - Up-to-date with ophthalmology visit - Foot exam performed today

## 2024-01-25 NOTE — Assessment & Plan Note (Addendum)
-   Received flu shot in December - Reminded to renew financial assistance paperwork (expires in a few days)

## 2024-01-25 NOTE — Assessment & Plan Note (Signed)
Blood pressure initially 140/73, but improved to 135/78 upon recheck.  Continue current regimen of amlodipine 10 mg daily, HCTZ 12.5 mg daily, lisinopril 40 mg daily, metoprolol 25 mg twice daily.

## 2024-02-01 NOTE — Progress Notes (Signed)
 Internal Medicine Clinic Attending  Case discussed with the resident at the time of the visit.  We reviewed the resident's history and exam and pertinent patient test results.  I agree with the assessment, diagnosis, and plan of care documented in the resident's note.

## 2024-03-26 ENCOUNTER — Other Ambulatory Visit (HOSPITAL_COMMUNITY): Payer: Self-pay

## 2024-04-22 ENCOUNTER — Ambulatory Visit (INDEPENDENT_AMBULATORY_CARE_PROVIDER_SITE_OTHER): Payer: Self-pay | Admitting: Student

## 2024-04-22 VITALS — BP 137/74 | HR 60 | Temp 98.2°F | Ht 66.0 in | Wt 187.1 lb

## 2024-04-22 DIAGNOSIS — I2510 Atherosclerotic heart disease of native coronary artery without angina pectoris: Secondary | ICD-10-CM

## 2024-04-22 DIAGNOSIS — R1030 Lower abdominal pain, unspecified: Secondary | ICD-10-CM

## 2024-04-22 DIAGNOSIS — E119 Type 2 diabetes mellitus without complications: Secondary | ICD-10-CM

## 2024-04-22 DIAGNOSIS — I10 Essential (primary) hypertension: Secondary | ICD-10-CM

## 2024-04-22 DIAGNOSIS — K219 Gastro-esophageal reflux disease without esophagitis: Secondary | ICD-10-CM

## 2024-04-22 DIAGNOSIS — Z7984 Long term (current) use of oral hypoglycemic drugs: Secondary | ICD-10-CM

## 2024-04-22 NOTE — Assessment & Plan Note (Signed)
 Famotidine  is effective for this.  He had an episode of food sticking 4 months ago but this sounds like a one-off.  He does not have any trouble with meat or bread or liquids.  No weight loss.  Screen for dysphagia at every visit, but no need for escalation for this now.

## 2024-04-22 NOTE — Assessment & Plan Note (Signed)
 Blood pressure okay today at 137/74.

## 2024-04-22 NOTE — Assessment & Plan Note (Signed)
 Weight stable, maintains active lifestyle.  Continue metformin  1000 mg twice daily.  A1c at follow-up.

## 2024-04-22 NOTE — Assessment & Plan Note (Signed)
 Having some suprapubic pain.  Feels similar to couple of years ago when he was found to have hematuria, although he does not know what the cause of this was.  I do not see any record of hematuria in his chart with our institution.  His urinary review of systems is normal and reassuring, but I will check a urinalysis today.  This could also be indigestion as it seems to be triggered by certain foods and gets somewhat better after bowel movement.  However he is not having any constipation or diarrhea.  No blood in the stool or melena.  CMP and CBC pending for broad laboratory workup of undifferentiated suprapubic pain.

## 2024-04-22 NOTE — Progress Notes (Signed)
 Patient name: Phillip Wells Date of birth: [birth year] Date of visit: 04/22/24  Subjective  Chief Complaint  Patient presents with   Abdominal Pain    Constant Lower abd pain-started last week, pain 2/10, feels a little bloated, has BM everyday with last one being this morning, spicy foods does cause some discomfort     HPI Phillip Wells is here for follow-up and to address concern of lower abdominal pain.  Some lower abdominal pain that started last Tuesday. Had a similar pain years ago, was found to have blood in urine but doesn't know why. It's not a strong pain, but constant. Trigger includes spicy food. No changes with bowel movements. Pain goes away "a bit" after bowel movement. No blood in stool or toilet. No diarrhea or constipation.  No problems with nocturia or polyuria, rarely gets up at nighttime to pee.  No troubles emptying his bladder.  Review of Systems  Constitutional:  Negative for malaise/fatigue and weight loss.  Respiratory:  Negative for cough and shortness of breath.   Gastrointestinal:  Positive for abdominal pain. Negative for blood in stool, constipation, diarrhea, melena and vomiting.  Genitourinary:  Negative for dysuria, frequency and hematuria.  Musculoskeletal:  Negative for falls.  Psychiatric/Behavioral:  Negative for depression. The patient does not have insomnia.     Patient Active Problem List   Diagnosis Date Noted   Lower abdominal pain 04/22/2024   Chest pain 10/24/2023   Hx of adenomatous colonic polyps 06/08/2023   Colon cancer screening 10/10/2022   Encounter for HCV screening test for low risk patient 10/10/2022   Uninsured 08/08/2022   Tinnitus of left ear 01/19/2021   Bilateral lower extremity edema 10/14/2019   Primary angle closure glaucoma of both eyes 11/29/2017   GERD (gastroesophageal reflux disease) 02/03/2016   Decreased hearing of both ears 08/06/2015   Glaucoma 02/24/2015   Healthcare maintenance  09/03/2013   History of transient ischemic attack (TIA) 07/28/2013   CAD (coronary artery disease) 06/29/2011   Type 2 diabetes mellitus (HCC) 03/11/2011   Essential hypertension    Hyperlipemia    Past Medical History:  Diagnosis Date   Coronary artery disease    s/p DES to the LAD   HTN (hypertension)    Hx of adenomatous colonic polyps 06/08/2023   Hyperlipemia    Incomplete RBBB    Myocardial infarction (HCC) 11/09/2010   Pre-diabetes    Outpatient Encounter Medications as of 04/22/2024  Medication Sig   amLODipine  (NORVASC ) 10 MG tablet Take 1 tablet (10 mg total) by mouth daily.   aspirin  EC (ASPIRIN  LOW DOSE) 81 MG tablet TAKE 1 Tablet BY MOUTH ONCE EVERY DAY   atorvastatin  (LIPITOR) 40 MG tablet TAKE 1 Tablet BY MOUTH ONCE EVERY DAY   brimonidine  (ALPHAGAN ) 0.2 % ophthalmic solution Place 1 drop into both eyes 3 (three) times daily. (Patient not taking: Reported on 05/04/2023)   dorzolamide -timolol  (COSOPT ) 2-0.5 % ophthalmic solution Place 1 drop into both eyes 2 (two) times daily.   famotidine  (PEPCID ) 20 MG tablet Take 1 tablet (20 mg total) by mouth 2 (two) times daily as needed for heartburn or indigestion.   hydrochlorothiazide  (HYDRODIURIL ) 12.5 MG tablet Take 1 tablet (12.5 mg total) by mouth daily.   latanoprost  (XALATAN ) 0.005 % ophthalmic solution Place 1 drop into both eyes nightly.   latanoprost  (XALATAN ) 0.005 % ophthalmic solution Place 1 drop into both eyes at bedtime.   lisinopril  (ZESTRIL ) 40 MG tablet Take 1 tablet (40 mg total)  by mouth daily.   metFORMIN  (GLUCOPHAGE ) 500 MG tablet Take 2 tablets (1,000 mg total) by mouth 2 (two) times daily with a meal.   metoprolol  tartrate (LOPRESSOR ) 25 MG tablet Take 1 tablet (25 mg total) by mouth 2 (two) times daily.   NITROSTAT  0.4 MG SL tablet PLACE 1 TABLET UNDER TONGUE AS NEEDED FOR CHEST PAIN EVERY 5 MINUTES X 3 MAX DOSES.  CALL 911 IF PAIN PERSISTS (Patient not taking: Reported on 05/25/2023)   timolol  (TIMOPTIC )  0.5 % ophthalmic solution INSTILL 1 DROP IN EACH EYE TWICE DAILY (Patient not taking: Reported on 05/04/2023)   No facility-administered encounter medications on file as of 04/22/2024.   Past Surgical History:  Procedure Laterality Date   APPENDECTOMY     CORONARY STENT PLACEMENT  10/12/2010   DES to LAD   Family History  Problem Relation Age of Onset   Heart disease Neg Hx    Colon cancer Neg Hx    Rectal cancer Neg Hx    Stomach cancer Neg Hx    Social History   Socioeconomic History   Marital status: Married    Spouse name: Not on file   Number of children: Not on file   Years of education: 3   Highest education level: Not on file  Occupational History   Occupation: CONSTRUCTION    Employer: [employer]  Tobacco Use   Smoking status: Former    Current packs/day: 0.00    Types: Cigarettes    Quit date: 01/31/1981    Years since quitting: 43.2   Smokeless tobacco: Never  Vaping Use   Vaping status: Never Used  Substance and Sexual Activity   Alcohol  use: No    Alcohol /week: 0.0 standard drinks of alcohol    Drug use: No   Sexual activity: Not on file  Other Topics Concern   Not on file  Social History Narrative   Not on file   Social Drivers of Health   Financial Resource Strain: Not on file  Food Insecurity: No Food Insecurity (04/22/2024)   Hunger Vital Sign    Worried About Running Out of Food in the Last Year: Never true    Ran Out of Food in the Last Year: Never true  Transportation Needs: No Transportation Needs (04/22/2024)   PRAPARE - Administrator, Civil Service (Medical): No    Lack of Transportation (Non-Medical): No  Physical Activity: Not on file  Stress: Not on file  Social Connections: Not on file  Intimate Partner Violence: Not on file     Objective  Today's Vitals   04/22/24 1537 04/22/24 1559  BP: 137/74   Pulse: 60   Temp: 98.2 F (36.8 C)   TempSrc: Oral   SpO2: 100%   Weight: 187 lb 1.6 oz (84.9 kg)   Height: 5'  6" (1.676 m)   PainSc:  2   Body mass index is 30.2 kg/m.   Physical Exam Constitutional:      General: He is not in acute distress.    Appearance: Normal appearance.  HENT:     Mouth/Throat:     Mouth: Mucous membranes are moist.  Eyes:     Extraocular Movements: Extraocular movements intact.     Conjunctiva/sclera: Conjunctivae normal.     Pupils: Pupils are equal, round, and reactive to light.  Neck:     Thyroid: No thyroid mass, thyromegaly or thyroid tenderness.     Vascular: No carotid bruit.  Cardiovascular:  Rate and Rhythm: Normal rate and regular rhythm.     Pulses: Normal pulses.     Heart sounds: No murmur heard. Pulmonary:     Effort: Pulmonary effort is normal.     Breath sounds: Normal breath sounds. No wheezing or rales.  Abdominal:     General: Bowel sounds are normal.     Palpations: Abdomen is soft. There is no hepatomegaly, splenomegaly or mass.     Tenderness: There is no abdominal tenderness.  Musculoskeletal:     Right lower leg: No edema.     Left lower leg: No edema.  Lymphadenopathy:     Cervical: No cervical adenopathy.  Skin:    General: Skin is warm and dry.  Neurological:     Mental Status: He is alert. Mental status is at baseline.     Cranial Nerves: No facial asymmetry.     Motor: No tremor.     Deep Tendon Reflexes: Reflexes normal.  Psychiatric:        Mood and Affect: Mood normal.        Behavior: Behavior normal.      Assessment & Plan  Problem List Items Addressed This Visit     GERD (gastroesophageal reflux disease)   Famotidine  is effective for this.  He had an episode of food sticking 4 months ago but this sounds like a one-off.  He does not have any trouble with meat or bread or liquids.  No weight loss.  Screen for dysphagia at every visit, but no need for escalation for this now.      Lower abdominal pain - Primary   Having some suprapubic pain.  Feels similar to couple of years ago when he was found to have  hematuria, although he does not know what the cause of this was.  I do not see any record of hematuria in his chart with our institution.  His urinary review of systems is normal and reassuring, but I will check a urinalysis today.  This could also be indigestion as it seems to be triggered by certain foods and gets somewhat better after bowel movement.  However he is not having any constipation or diarrhea.  No blood in the stool or melena.  CMP and CBC pending for broad laboratory workup of undifferentiated suprapubic pain.      Relevant Orders   Urinalysis, Complete (81001)   CBC with Differential/Platelet   CMP14 + Anion Gap   Essential hypertension (Chronic)   Blood pressure okay today at 137/74.      Type 2 diabetes mellitus (HCC) (Chronic)   Weight stable, maintains active lifestyle.  Continue metformin  1000 mg twice daily.  A1c at follow-up.      CAD (coronary artery disease) (Chronic)   Continue atorvastatin  and metoprolol .  Modifiable risk factors are pretty well-controlled.      Other Visit Diagnoses       Essential (primary) hypertension       Relevant Orders   CMP14 + Anion Gap      Return in about 3 months (around 07/23/2024) for routine follow-up.  Adria Hopkins MD 04/22/2024, 5:56 PM

## 2024-04-22 NOTE — Patient Instructions (Addendum)
 Le har algunos anlisis de sangre y Engineer, structural para informarle de [place].  Regrese a Glass blower/designer en 3 meses para un chequeo de rutina.  Recuerde traer [employer] que toma (incluidos los de venta libre y los suplementos) a cada visita a Glass blower/designer.  Este resumen posterior a la visita es una revisin importante de las pruebas, las derivaciones y los cambios de medicacin que se comentaron durante su visita. Si tiene alguna pregunta o inquietud, llame al [phone]. Fuera del horario de atencin de Glass blower/designer, llame al hospital principal al [phone] y pregunte a la operadora por el residente de [place] interna de [place].   Adria Hopkins MD 04/22/2024, 4:27 PM

## 2024-04-22 NOTE — Assessment & Plan Note (Signed)
 Continue atorvastatin  and metoprolol .  Modifiable risk factors are pretty well-controlled.

## 2024-04-23 LAB — URINALYSIS, COMPLETE
Bilirubin, UA: NEGATIVE
Glucose, UA: NEGATIVE
Ketones, UA: NEGATIVE
Leukocytes,UA: NEGATIVE
Nitrite, UA: NEGATIVE
Protein,UA: NEGATIVE
RBC, UA: NEGATIVE
Specific Gravity, UA: 1.021 (ref 1.005–1.030)
Urobilinogen, Ur: 0.2 mg/dL (ref 0.2–1.0)
pH, UA: 6.5 (ref 5.0–7.5)

## 2024-04-23 LAB — MICROSCOPIC EXAMINATION
Bacteria, UA: NONE SEEN
Casts: NONE SEEN /LPF
Epithelial Cells (non renal): NONE SEEN /HPF (ref 0–10)
RBC, Urine: NONE SEEN /HPF (ref 0–2)
WBC, UA: NONE SEEN /HPF (ref 0–5)

## 2024-04-23 LAB — CBC WITH DIFFERENTIAL/PLATELET
Basophils Absolute: 0 10*3/uL (ref 0.0–0.2)
Basos: 1 %
EOS (ABSOLUTE): 0.2 10*3/uL (ref 0.0–0.4)
Eos: 2 %
Hematocrit: 43.6 % (ref 37.5–51.0)
Hemoglobin: 14.2 g/dL (ref 13.0–17.7)
Immature Grans (Abs): 0 10*3/uL (ref 0.0–0.1)
Immature Granulocytes: 0 %
Lymphocytes Absolute: 1.7 10*3/uL (ref 0.7–3.1)
Lymphs: 22 %
MCH: 30.7 pg (ref 26.6–33.0)
MCHC: 32.6 g/dL (ref 31.5–35.7)
MCV: 94 fL (ref 79–97)
Monocytes Absolute: 1 10*3/uL — ABNORMAL HIGH (ref 0.1–0.9)
Monocytes: 13 %
Neutrophils Absolute: 4.7 10*3/uL (ref 1.4–7.0)
Neutrophils: 62 %
Platelets: 243 10*3/uL (ref 150–450)
RBC: 4.62 x10E6/uL (ref 4.14–5.80)
RDW: 13.2 % (ref 11.6–15.4)
WBC: 7.6 10*3/uL (ref 3.4–10.8)

## 2024-04-23 LAB — CMP14 + ANION GAP
ALT: 20 IU/L (ref 0–44)
AST: 23 IU/L (ref 0–40)
Albumin: 4.5 g/dL (ref 3.8–4.8)
Alkaline Phosphatase: 80 IU/L (ref 44–121)
Anion Gap: 17 mmol/L (ref 10.0–18.0)
BUN/Creatinine Ratio: 17 (ref 10–24)
BUN: 17 mg/dL (ref 8–27)
Bilirubin Total: 0.3 mg/dL (ref 0.0–1.2)
CO2: 22 mmol/L (ref 20–29)
Calcium: 9.2 mg/dL (ref 8.6–10.2)
Chloride: 102 mmol/L (ref 96–106)
Creatinine, Ser: 1.03 mg/dL (ref 0.76–1.27)
Globulin, Total: 2.8 g/dL (ref 1.5–4.5)
Glucose: 106 mg/dL — ABNORMAL HIGH (ref 70–99)
Potassium: 4.7 mmol/L (ref 3.5–5.2)
Sodium: 141 mmol/L (ref 134–144)
Total Protein: 7.3 g/dL (ref 6.0–8.5)
eGFR: 76 mL/min/{1.73_m2} (ref >59)

## 2024-04-23 NOTE — Progress Notes (Signed)
 Internal Medicine Clinic Attending  Case discussed with the resident at the time of the visit.  We reviewed the resident's history and exam and pertinent patient test results.  I agree with the assessment, diagnosis, and plan of care documented in the resident's note.

## 2024-04-24 ENCOUNTER — Ambulatory Visit: Payer: Self-pay | Admitting: Student

## 2024-05-01 ENCOUNTER — Other Ambulatory Visit: Payer: Self-pay | Admitting: Internal Medicine

## 2024-05-01 DIAGNOSIS — E119 Type 2 diabetes mellitus without complications: Secondary | ICD-10-CM

## 2024-05-01 NOTE — Telephone Encounter (Signed)
 Medication sent to pharmacy

## 2024-06-17 ENCOUNTER — Ambulatory Visit: Payer: Self-pay

## 2024-08-15 ENCOUNTER — Ambulatory Visit: Payer: Self-pay

## 2024-08-15 ENCOUNTER — Other Ambulatory Visit: Payer: Self-pay

## 2024-08-15 VITALS — BP 165/91 | HR 82 | Temp 98.1°F | Ht 66.0 in | Wt 186.2 lb

## 2024-08-15 DIAGNOSIS — L989 Disorder of the skin and subcutaneous tissue, unspecified: Secondary | ICD-10-CM

## 2024-08-15 DIAGNOSIS — K219 Gastro-esophageal reflux disease without esophagitis: Secondary | ICD-10-CM

## 2024-08-15 DIAGNOSIS — Z79899 Other long term (current) drug therapy: Secondary | ICD-10-CM

## 2024-08-15 DIAGNOSIS — I2510 Atherosclerotic heart disease of native coronary artery without angina pectoris: Secondary | ICD-10-CM

## 2024-08-15 DIAGNOSIS — I10 Essential (primary) hypertension: Secondary | ICD-10-CM

## 2024-08-15 DIAGNOSIS — Z23 Encounter for immunization: Secondary | ICD-10-CM

## 2024-08-15 DIAGNOSIS — Z7984 Long term (current) use of oral hypoglycemic drugs: Secondary | ICD-10-CM

## 2024-08-15 DIAGNOSIS — E119 Type 2 diabetes mellitus without complications: Secondary | ICD-10-CM

## 2024-08-15 MED ORDER — AMLODIPINE BESYLATE 10 MG PO TABS: 10.0000 mg | ORAL_TABLET | Freq: Every day | ORAL | 3 refills | Status: AC

## 2024-08-15 MED ORDER — METOPROLOL TARTRATE 25 MG PO TABS: 25.0000 mg | ORAL_TABLET | Freq: Two times a day (BID) | ORAL | 3 refills | Status: AC

## 2024-08-15 MED ORDER — LISINOPRIL 40 MG PO TABS: 40.0000 mg | ORAL_TABLET | Freq: Every day | ORAL | 3 refills | Status: AC

## 2024-08-15 MED ORDER — ASPIRIN 81 MG PO TBEC: DELAYED_RELEASE_TABLET | ORAL | 3 refills | Status: AC

## 2024-08-15 MED ORDER — METFORMIN HCL 1000 MG PO TABS: 1000.0000 mg | ORAL_TABLET | Freq: Two times a day (BID) | ORAL | 3 refills | Status: AC

## 2024-08-15 MED ORDER — ATORVASTATIN CALCIUM 40 MG PO TABS: ORAL_TABLET | ORAL | 3 refills | Status: AC

## 2024-08-15 MED ORDER — FAMOTIDINE 20 MG PO TABS: 20.0000 mg | ORAL_TABLET | Freq: Two times a day (BID) | ORAL | 3 refills | Status: AC | PRN

## 2024-08-15 MED ORDER — HYDROCHLOROTHIAZIDE 12.5 MG PO TABS: 12.5000 mg | ORAL_TABLET | Freq: Every day | ORAL | 3 refills | Status: AC

## 2024-08-15 NOTE — Assessment & Plan Note (Signed)
 Patient has been working on his diet, specifically he has been eating less tortillas.  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  His last A1c in February of this year was 7.2, will get A1c at his blood pressure check within a week.  Patient denies changes in vision, changes in urination, and numbness and tingling in the toes or fingers.  Last foot exam done in February.  Plan to continue metformin  1000 mg twice daily and will recheck A1c, lipids, and BMP at follow-up. Orders:   atorvastatin  (LIPITOR) 40 MG tablet; TAKE 1 Tablet BY MOUTH ONCE EVERY DAY   metFORMIN  (GLUCOPHAGE ) 1000 MG tablet; Take 1 tablet (1,000 mg total) by mouth 2 (two) times daily with a meal.

## 2024-08-15 NOTE — Assessment & Plan Note (Signed)
 Denies trouble swallowing.  Only takes famotidine  as needed, he eats a lot of spicy foods but he does not need to take famotidine  every day. Orders:   famotidine  (PEPCID ) 20 MG tablet; Take 1 tablet (20 mg total) by mouth 2 (two) times daily as needed for heartburn or indigestion.

## 2024-08-15 NOTE — Progress Notes (Signed)
 Established Patient Office Visit  Subjective   Patient ID: Phillip Wells, male    DOB: [birth year]  Age: 73 y.o. MRN: [id number]  Patient here for A1c follow-up.  Unfortunately did not see patient until lab had already closed.  Patient's blood pressure was elevated today, will have him come back as soon as possible for blood pressure recheck and will get labs then.  Patient had low back pain 3 weeks ago that started at work, this has since resolved after 2 days of ibuprofen and massages from his wife.  Patient has no other complaints and is feeling well.        Objective:     BP (!) 165/91 (BP Location: Right Arm, Patient Position: Sitting, Cuff Size: Normal)   Pulse 82   Temp 98.1 F (36.7 C) (Oral)   Ht 5' 6 (1.676 m)   Wt 186 lb 3.2 oz (84.5 kg)   SpO2 93%   BMI 30.05 kg/m  BP Readings from Last 3 Encounters:  08/15/24 (!) 165/91  04/22/24 137/74  01/24/24 135/78      Physical Exam Vitals reviewed.  Constitutional:      Appearance: Normal appearance.  HENT:     Nose: Nose normal.     Mouth/Throat:     Mouth: Mucous membranes are moist.     Pharynx: Oropharynx is clear.  Eyes:     General: No scleral icterus.    Conjunctiva/sclera: Conjunctivae normal.  Cardiovascular:     Rate and Rhythm: Normal rate and regular rhythm.     Pulses: Normal pulses.     Heart sounds: Normal heart sounds.  Pulmonary:     Effort: Pulmonary effort is normal.     Breath sounds: Normal breath sounds. No wheezing or rales.  Abdominal:     General: There is no distension.     Palpations: Abdomen is soft. There is no mass.     Tenderness: There is no abdominal tenderness. There is no guarding.  Musculoskeletal:        General: No swelling or tenderness.     Right lower leg: No edema.     Left lower leg: No edema.  Skin:    General: Skin is warm and dry.     Comments: Right Malar cheek has pigmented patch, photo below  Neurological:     General: No focal deficit  present.     Mental Status: He is alert and oriented to person, place, and time.     Cranial Nerves: No cranial nerve deficit.     Motor: No weakness.  Psychiatric:        Mood and Affect: Mood normal.        Behavior: Behavior normal.       No results found for any visits on 08/15/24.  Last hemoglobin A1c Lab Results  Component Value Date   HGBA1C 7.2 (A) 01/24/2024      The ASCVD Risk score (Arnett DK, et al., 2019) failed to calculate for the following reasons:   Risk score cannot be calculated because patient has a medical history suggesting prior/existing ASCVD    Assessment & Plan:   Assessment & Plan Essential hypertension Patient's blood pressure was initially elevated at 169/92, on recheck it was 165/91.  At the last office visit in May blood pressure was 137/74, it was 135/78 in February.  Patient does not have a blood pressure cuff at home and does not routinely check his blood pressures.  Denies recent changes in  vision, new headaches or changes with urination.  Patient currently feels good and is not in any pain, he is hungry.  Current regimen includes amlodipine  10 mg daily, HCTZ 12 mg daily, lisinopril  40 mg daily, and metoprolol  25 mg twice daily.  Plan to have patient come back within a week for a blood pressure check.  Will not change any medications at this time.  If blood pressure is elevated again at this time, consider increasing HCTZ to 25 mg once daily.  Orders:   amLODipine  (NORVASC ) 10 MG tablet; Take 1 tablet (10 mg total) by mouth daily.   hydrochlorothiazide  (HYDRODIURIL ) 12.5 MG tablet; Take 1 tablet (12.5 mg total) by mouth daily.   lisinopril  (ZESTRIL ) 40 MG tablet; Take 1 tablet (40 mg total) by mouth daily.   metoprolol  tartrate (LOPRESSOR ) 25 MG tablet; Take 1 tablet (25 mg total) by mouth 2 (two) times daily.  Coronary artery disease involving native coronary artery of native heart without angina pectoris Patient has been working to improve  his diet. Lipid panel last checked in October 2023, will check this at his blood pressure follow-up in a week.  Given history of diabetes and hypertension, goal LDL is less than 70.  Continue current medicines.  Orders:   aspirin  EC (ASPIRIN  LOW DOSE) 81 MG tablet; TAKE 1 Tablet BY MOUTH ONCE EVERY DAY   lisinopril  (ZESTRIL ) 40 MG tablet; Take 1 tablet (40 mg total) by mouth daily.   metoprolol  tartrate (LOPRESSOR ) 25 MG tablet; Take 1 tablet (25 mg total) by mouth 2 (two) times daily.  Type 2 diabetes mellitus without complication, without long-term current use of insulin  [facility]) Patient has been working on his diet, specifically he has been eating less tortillas.  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  His last A1c in February of this year was 7.2, will get A1c at his blood pressure check within a week.  Patient denies changes in vision, changes in urination, and numbness and tingling in the toes or fingers.  Last foot exam done in February.  Plan to continue metformin  1000 mg twice daily and will recheck A1c, lipids, and BMP at follow-up. Orders:   atorvastatin  (LIPITOR) 40 MG tablet; TAKE 1 Tablet BY MOUTH ONCE EVERY DAY   metFORMIN  (GLUCOPHAGE ) 1000 MG tablet; Take 1 tablet (1,000 mg total) by mouth 2 (two) times daily with a meal.  Gastroesophageal reflux disease without esophagitis Denies trouble swallowing.  Only takes famotidine  as needed, he eats a lot of spicy foods but he does not need to take famotidine  every day. Orders:   famotidine  (PEPCID ) 20 MG tablet; Take 1 tablet (20 mg total) by mouth 2 (two) times daily as needed for heartburn or indigestion.  Encounter for immunization Flu vaccine given today. Orders:   Flu vaccine HIGH DOSE PF(Fluzone Trivalent)  Skin lesion Pigmented patch on malar cheek, patient has tried to pick it off and has tried creams but is not able to get it off.  Encouraged patient not to mess with it anymore.  Differential includes seborrheic  keratoses, pigmented nevus, solar lentigo, pigmented BCC, pigmented SCC, melanoma.  Patient has a few other pigmented lesions along his face commonly mostly on sun exposed skin.  Will refer to dermatology. Picture in media tab.  Orders:   Ambulatory referral to Dermatology   Return for return as soon as possible for BP recheck and labs.    Viktoria King, DO

## 2024-08-15 NOTE — Assessment & Plan Note (Signed)
 Patient has been working to improve his diet. Lipid panel last checked in October 2023, will check this at his blood pressure follow-up in a week.  Given history of diabetes and hypertension, goal LDL is less than 70.  Continue current medicines.  Orders:   aspirin  EC (ASPIRIN  LOW DOSE) 81 MG tablet; TAKE 1 Tablet BY MOUTH ONCE EVERY DAY   lisinopril  (ZESTRIL ) 40 MG tablet; Take 1 tablet (40 mg total) by mouth daily.   metoprolol  tartrate (LOPRESSOR ) 25 MG tablet; Take 1 tablet (25 mg total) by mouth 2 (two) times daily.

## 2024-08-15 NOTE — Patient Instructions (Addendum)
  Fue un placer verte hoy!  Por favor, recuerda...  1) Seguir tomando tus medicamentos segn lo recetado; no hicimos ningn cambio en esta visita.  2) Llamar a landry andres balloon al [phone] para programar tu cita de seguimiento lo antes posible. Te volveremos a tomar la presin arterial y te haremos anlisis de laboratorio en esta visita.  3) Te derivamos a dermatologa y te llamarn para programar una cita.  Y no olvides vacunarte contra la gripe antes de que termine [place]!  Si tienes alguna pregunta, no dudes en llamar a la clnica en cualquier momento al [phone]. Que tengas un buen da, Dr. Charmayne!  --------------------   It was wonderful seeing you today!   Please remember to...  1) continue taking your medications as prescribed, we did not change anything at this visit  2) call our office, [phone], tomorrow to make your follow up appointment as soon as possible. We will recheck your blood pressure and get labs at this visit.   3) We placed a referral to dermatology and they will call you to make an appointment   And don't forget to get your flu shot before the end of October!  If you have any questions please feel free to the call the clinic at anytime at [phone].  Have a blessed day,  Dr. Charmayne

## 2024-08-15 NOTE — Assessment & Plan Note (Signed)
 Patient's blood pressure was initially elevated at 169/92, on recheck it was 165/91.  At the last office visit in May blood pressure was 137/74, it was 135/78 in February.  Patient does not have a blood pressure cuff at home and does not routinely check his blood pressures.  Denies recent changes in vision, new headaches or changes with urination.  Patient currently feels good and is not in any pain, he is hungry.  Current regimen includes amlodipine  10 mg daily, HCTZ 12 mg daily, lisinopril  40 mg daily, and metoprolol  25 mg twice daily.  Plan to have patient come back within a week for a blood pressure check.  Will not change any medications at this time.  If blood pressure is elevated again at this time, consider increasing HCTZ to 25 mg once daily.  Orders:   amLODipine  (NORVASC ) 10 MG tablet; Take 1 tablet (10 mg total) by mouth daily.   hydrochlorothiazide  (HYDRODIURIL ) 12.5 MG tablet; Take 1 tablet (12.5 mg total) by mouth daily.   lisinopril  (ZESTRIL ) 40 MG tablet; Take 1 tablet (40 mg total) by mouth daily.   metoprolol  tartrate (LOPRESSOR ) 25 MG tablet; Take 1 tablet (25 mg total) by mouth 2 (two) times daily.

## 2024-08-20 ENCOUNTER — Other Ambulatory Visit (HOSPITAL_COMMUNITY): Payer: Self-pay

## 2024-08-21 ENCOUNTER — Other Ambulatory Visit (HOSPITAL_COMMUNITY): Payer: Self-pay

## 2024-08-21 MED ORDER — DORZOLAMIDE HCL-TIMOLOL MAL 2-0.5 % OP SOLN
1.0000 [drp] | Freq: Two times a day (BID) | OPHTHALMIC | 0 refills | Status: AC
  Filled 2024-08-21: qty 10, 50d supply, fill #0

## 2024-08-21 MED ORDER — LATANOPROST 0.005 % OP SOLN
1.0000 [drp] | Freq: Every evening | OPHTHALMIC | 0 refills | Status: AC
  Filled 2024-08-21: qty 2.5, 25d supply, fill #0

## 2024-08-21 NOTE — Progress Notes (Signed)
 Internal Medicine Clinic Attending  Case discussed with the resident at the time of the visit.  We reviewed the resident's history and exam and pertinent patient test results.  I agree with the assessment, diagnosis, and plan of care documented in the resident's note.

## 2024-08-22 ENCOUNTER — Other Ambulatory Visit (HOSPITAL_COMMUNITY): Payer: Self-pay

## 2024-09-10 ENCOUNTER — Ambulatory Visit: Payer: Self-pay

## 2024-09-12 ENCOUNTER — Ambulatory Visit: Payer: Self-pay | Admitting: *Deleted

## 2024-09-12 VITALS — BP 166/91 | HR 68

## 2024-09-12 DIAGNOSIS — E785 Hyperlipidemia, unspecified: Secondary | ICD-10-CM

## 2024-09-12 DIAGNOSIS — E119 Type 2 diabetes mellitus without complications: Secondary | ICD-10-CM

## 2024-09-12 DIAGNOSIS — I10 Essential (primary) hypertension: Secondary | ICD-10-CM

## 2024-09-12 NOTE — Progress Notes (Unsigned)
    Phillip Wells presented today for blood pressure check. Patient is prescribed blood pressure medications and I confirmed that patient did take their blood pressure medication prior to today's appointment. Blood pressure was taken in the usual and appropriate manner using an automated BP cuff.     Vitals:   09/12/24 1547 09/12/24 1558  BP: (!) 167/87 (!) 166/91  Repeat Blood pressure 166/91    Results of today's visit will be routed to Dr.  Eilene Team for review and further management.

## 2024-09-13 LAB — BASIC METABOLIC PANEL WITH GFR
BUN/Creatinine Ratio: 23 (ref 10–24)
BUN: 19 mg/dL (ref 8–27)
CO2: 21 mmol/L (ref 20–29)
Calcium: 8.9 mg/dL (ref 8.6–10.2)
Chloride: 101 mmol/L (ref 96–106)
Creatinine, Ser: 0.84 mg/dL (ref 0.76–1.27)
Glucose: 92 mg/dL (ref 70–99)
Potassium: 4.1 mmol/L (ref 3.5–5.2)
Sodium: 138 mmol/L (ref 134–144)
eGFR: 91 mL/min/1.73 (ref >59)

## 2024-09-13 LAB — HEMOGLOBIN A1C
Est. average glucose Bld gHb Est-mCnc: 146 mg/dL
Hgb A1c MFr Bld: 6.7 % — ABNORMAL HIGH (ref 4.8–5.6)

## 2024-09-13 LAB — LIPID PANEL
Chol/HDL Ratio: 3.3 ratio (ref 0.0–5.0)
Cholesterol, Total: 155 mg/dL (ref 100–199)
HDL: 47 mg/dL (ref >39)
LDL Chol Calc (NIH): 87 mg/dL (ref 0–99)
Triglycerides: 119 mg/dL (ref 0–149)
VLDL Cholesterol Cal: 21 mg/dL (ref 5–40)

## 2024-09-13 NOTE — Progress Notes (Signed)
 Reviewed the blood pressure visit. Plan is to increase the hydrochlorothiazide  to 25 mg. Follow up in month. Instructed nurse to call.

## 2024-10-03 ENCOUNTER — Telehealth: Payer: Self-pay | Admitting: *Deleted

## 2024-10-03 NOTE — Telephone Encounter (Signed)
 Call to patient through [facility] [id number].  Spoke with daughter informed her of need for patient to increase his hydrochlorothiazide  from 12.5  mg to 25 mg.  Patient has been on Hydrochlorothiazide   12.5 mg.  Will need to take 2 tablets daily instead of 1.  Daughter voiced understanding of the plan.  Will have patient take 2 tablets daily.  Encourage patient's daughter to use the medication he has at home and to call for a prescription of the new dosing when needed.  Also asked daughter to have patient to come in for an appointment 1 month after he has started the new dosing.  Daughter to call later for the appointment after patient starts the new medication.  Asked daughter to also call of the office if the patient experiences any problems like dizziness or headaches after starting the new dosing. Daughter voiced understanding of.
